# Patient Record
Sex: Male | Born: 1975
Health system: Southern US, Community
[De-identification: ages and names within clinical notes are randomized; demographics above are authoritative.]

## PROBLEM LIST (undated history)

## (undated) DIAGNOSIS — E785 Hyperlipidemia, unspecified: Secondary | ICD-10-CM

## (undated) DIAGNOSIS — I10 Essential (primary) hypertension: Secondary | ICD-10-CM

## (undated) HISTORY — DX: Hyperlipidemia, unspecified: E78.5

## (undated) HISTORY — DX: Essential (primary) hypertension: I10

---

## 2011-03-09 ENCOUNTER — Encounter (HOSPITAL_COMMUNITY): Payer: Self-pay

## 2011-03-09 ENCOUNTER — Emergency Department (INDEPENDENT_AMBULATORY_CARE_PROVIDER_SITE_OTHER)
Admission: EM | Admit: 2011-03-09 | Discharge: 2011-03-09 | Disposition: A | Discharge status: Home / Self Care | Payer: BC Managed Care – PPO | Source: Home / Self Care | Attending: Emergency Medicine | Admitting: Emergency Medicine

## 2011-03-09 ENCOUNTER — Emergency Department (INDEPENDENT_AMBULATORY_CARE_PROVIDER_SITE_OTHER): Payer: BC Managed Care – PPO

## 2011-03-09 DIAGNOSIS — S63601A Unspecified sprain of right thumb, initial encounter: Secondary | ICD-10-CM

## 2011-03-09 DIAGNOSIS — S6390XA Sprain of unspecified part of unspecified wrist and hand, initial encounter: Secondary | ICD-10-CM

## 2011-03-09 MED ORDER — IBUPROFEN 600 MG PO TABS: 600.0000 mg | ORAL_TABLET | Freq: Four times a day (QID) | ORAL | Status: AC | PRN

## 2011-03-09 MED ORDER — HYDROCODONE-ACETAMINOPHEN 5-325 MG PO TABS: 2.0000 | ORAL_TABLET | ORAL | Status: AC | PRN

## 2011-03-09 NOTE — ED Provider Notes (Signed)
History     CSN: [id number]  Arrival date & time 03/09/11  1238   First MD Initiated Contact with Patient 03/09/11 1251      Chief Complaint  Patient presents with  . Hand Injury    (Consider location/radiation/quality/duration/timing/severity/associated sxs/prior treatment) HPI Comments: Patient is a right-handed male who states that he slipped and fell on some ice earlier today landing on his right outstretched thumb/hand. States that the thumb was forcibly deviated radially. Reports some pain, swelling in his thumb. No paresthesias, redness, gross deformity, weakness. No bruising, abrasion. No nausea, vomiting, fevers. No injury to this hand, wrist, forearm, elbow, shoulder. Has not tried anything for this.  Patient is a 36 y.o. male presenting with hand injury. The history is provided by the patient. No language interpreter was used.  Hand Injury  The incident occurred 3 to 5 hours ago. The incident occurred at home. The injury mechanism was a fall. The pain is present in the right hand. The quality of the pain is described as aching and throbbing. The pain has been constant since the incident. Pertinent negatives include no fever. The symptoms are aggravated by movement, palpation and use. He has tried nothing for the symptoms. The treatment provided no relief.    History reviewed. No pertinent past medical history.  History reviewed. No pertinent past surgical history.  History reviewed. No pertinent family history.  History  Substance Use Topics  . Smoking status: Not on file  . Smokeless tobacco: Not on file  . Alcohol Use: Not on file      Review of Systems  Constitutional: Negative for fever.  Musculoskeletal: Positive for arthralgias.  Skin: Negative for rash and wound.  Neurological: Negative for weakness and numbness.    Allergies  Review of patient's allergies indicates no known allergies.  Home Medications   Current Outpatient Rx  Name Route Sig  Dispense Refill  . HYDROCODONE-ACETAMINOPHEN 5-325 MG PO TABS Oral Take 2 tablets by mouth every 4 (four) hours as needed for pain. 20 tablet 0  . IBUPROFEN 600 MG PO TABS Oral Take 1 tablet (600 mg total) by mouth every 6 (six) hours as needed for pain. 30 tablet 0    BP 146/98  Pulse 63  Temp(Src) 98.2 F (36.8 C) (Oral)  Resp 18  SpO2 100%  Physical Exam  Nursing note and vitals reviewed. Constitutional: He is oriented to person, place, and time. He appears well-developed and well-nourished.  HENT:  Head: Normocephalic and atraumatic.  Eyes: Conjunctivae and EOM are normal.  Neck: Normal range of motion.  Cardiovascular: Normal rate.   Pulmonary/Chest: Effort normal. No respiratory distress.  Abdominal: He exhibits no distension.  Musculoskeletal: Normal range of motion.       Right wrist: Normal.       Hands:      Tenderness at right MP joint. No laxity on varus/valgus stress. Baseline Strength and sensation in median/radial/ulnar nerve distribution with CR< 2 secs and pulse intact.  Hand with intact motor strength 5/5 flexion / extension against resistance. Skin intact. No signs of trauma. Wrist WNL.    Neurological: He is alert and oriented to person, place, and time.  Skin: Skin is warm and dry.  Psychiatric: He has a normal mood and affect. His behavior is normal.    ED Course  Procedures (including critical care time)  Labs Reviewed - No data to display Dg Hand Complete Right  03/09/2011  *RADIOLOGY REPORT*  Clinical Data: Larey Seat this morning, now  with pain  RIGHT HAND - COMPLETE 3+ VIEW  Comparison: None.  Findings: The radiocarpal joint space appears normal.  The carpal bones are in normal position.  MCP, PIP, and DIP joints appear normal.  No acute abnormality is seen.  IMPRESSION: No acute abnormality.  Original Report Authenticated By: Juline Patch, M.D.     1. Sprain of right thumb    Imaging reviewed by myself. Report per radiologist.    MDM  Suspect  sprain of the ulnar collateral ligament. MC joint stable. No fracture seen on x-ray. Reviewed this with patient. Will place in thumb spica, and have him follow up with hand and approximately 10 days.  Luiz Blare, MD 03/09/11 [phone]

## 2011-03-09 NOTE — Discharge Instructions (Signed)
Wear the splint until you are evaluated by a hand specialist. This may take up to 4-6 weeks to completely heal. You may take it out for range of motion exercises in several days. Right now he needs to wait until starts getting better. Return if you a fever above 100.4, if you get worse, if you have redness, swelling, or for any other concerns.

## 2011-03-09 NOTE — ED Notes (Signed)
States he fell on ice earlier today, put out right hand to catch himself , and injured hand. . C/o pain MP joint and MC bone of thumb

## 2013-05-04 ENCOUNTER — Encounter (HOSPITAL_COMMUNITY): Payer: Self-pay | Admitting: Emergency Medicine

## 2013-05-04 ENCOUNTER — Emergency Department (HOSPITAL_COMMUNITY): Payer: BC Managed Care – PPO

## 2013-05-04 ENCOUNTER — Emergency Department (HOSPITAL_COMMUNITY)
Admission: EM | Admit: 2013-05-04 | Discharge: 2013-05-04 | Disposition: A | Discharge status: Home / Self Care | Payer: BC Managed Care – PPO | Attending: Emergency Medicine | Admitting: Emergency Medicine

## 2013-05-04 DIAGNOSIS — F411 Generalized anxiety disorder: Secondary | ICD-10-CM | POA: Insufficient documentation

## 2013-05-04 DIAGNOSIS — R079 Chest pain, unspecified: Secondary | ICD-10-CM

## 2013-05-04 DIAGNOSIS — R072 Precordial pain: Secondary | ICD-10-CM | POA: Insufficient documentation

## 2013-05-04 DIAGNOSIS — R42 Dizziness and giddiness: Secondary | ICD-10-CM | POA: Insufficient documentation

## 2013-05-04 LAB — COMPREHENSIVE METABOLIC PANEL
ALBUMIN: 4.2 g/dL (ref 3.5–5.2)
ALT: 31 U/L (ref 0–53)
AST: 24 U/L (ref 0–37)
Alkaline Phosphatase: 78 U/L (ref 39–117)
BUN: 12 mg/dL (ref 6–23)
CO2: 26 mEq/L (ref 19–32)
CREATININE: 1.17 mg/dL (ref 0.50–1.35)
Calcium: 9.9 mg/dL (ref 8.4–10.5)
Chloride: 102 mEq/L (ref 96–112)
GFR calc Af Amer: 89 mL/min — ABNORMAL LOW (ref >90)
GFR calc non Af Amer: 77 mL/min — ABNORMAL LOW (ref >90)
Glucose, Bld: 101 mg/dL — ABNORMAL HIGH (ref 70–99)
Potassium: 4.1 mEq/L (ref 3.7–5.3)
Sodium: 144 mEq/L (ref 137–147)
TOTAL PROTEIN: 8 g/dL (ref 6.0–8.3)
Total Bilirubin: 0.5 mg/dL (ref 0.3–1.2)

## 2013-05-04 LAB — TROPONIN I

## 2013-05-04 LAB — CBC
HCT: 51.1 % (ref 39.0–52.0)
Hemoglobin: 17.7 g/dL — ABNORMAL HIGH (ref 13.0–17.0)
MCH: 28.5 pg (ref 26.0–34.0)
MCHC: 34.6 g/dL (ref 30.0–36.0)
MCV: 82.3 fL (ref 78.0–100.0)
PLATELETS: 194 10*3/uL (ref 150–400)
RBC: 6.21 MIL/uL — ABNORMAL HIGH (ref 4.22–5.81)
RDW: 13.8 % (ref 11.5–15.5)
WBC: 7.5 10*3/uL (ref 4.0–10.5)

## 2013-05-04 NOTE — ED Notes (Signed)
EDP AWARE OF RESULT. WILL DISPO PT

## 2013-05-04 NOTE — Discharge Instructions (Signed)
If gi symptoms, you may try pepcid and maalox as need for symptom relief. For chest discomfort, follow up with cardiologist in coming week - see referral - call office to arrange appointment. Return to ER right away if worse, persistent or recurrent chest pain, trouble breathing, weak/faint, other concern.      Chest Pain (Nonspecific) It is often hard to give a specific diagnosis for the cause of chest pain. There is always a chance that your pain could be related to something serious, such as a heart attack or a blood clot in the lungs. You need to follow up with your caregiver for further evaluation. CAUSES   Heartburn.  Pneumonia or bronchitis.  Anxiety or stress.  Inflammation around your heart (pericarditis) or lung (pleuritis or pleurisy).  A blood clot in the lung.  A collapsed lung (pneumothorax). It can develop suddenly on its own (spontaneous pneumothorax) or from injury (trauma) to the chest.  Shingles infection (herpes zoster virus). The chest wall is composed of bones, muscles, and cartilage. Any of these can be the source of the pain.  The bones can be bruised by injury.  The muscles or cartilage can be strained by coughing or overwork.  The cartilage can be affected by inflammation and become sore (costochondritis). DIAGNOSIS  Lab tests or other studies, such as X-rays, electrocardiography, stress testing, or cardiac imaging, may be needed to find the cause of your pain.  TREATMENT   Treatment depends on what may be causing your chest pain. Treatment may include:  Acid blockers for heartburn.  Anti-inflammatory medicine.  Pain medicine for inflammatory conditions.  Antibiotics if an infection is present.  You may be advised to change lifestyle habits. This includes stopping smoking and avoiding alcohol, caffeine, and chocolate.  You may be advised to keep your head raised (elevated) when sleeping. This reduces the chance of acid going backward from your  stomach into your esophagus.  Most of the time, nonspecific chest pain will improve within 2 to 3 days with rest and mild pain medicine. HOME CARE INSTRUCTIONS   If antibiotics were prescribed, take your antibiotics as directed. Finish them even if you start to feel better.  For the next few days, avoid physical activities that bring on chest pain. Continue physical activities as directed.  Do not smoke.  Avoid drinking alcohol.  Only take over-the-counter or prescription medicine for pain, discomfort, or fever as directed by your caregiver.  Follow your caregiver's suggestions for further testing if your chest pain does not go away.  Keep any follow-up appointments you made. If you do not go to an appointment, you could develop lasting (chronic) problems with pain. If there is any problem keeping an appointment, you must call to reschedule. SEEK MEDICAL CARE IF:   You think you are having problems from the medicine you are taking. Read your medicine instructions carefully.  Your chest pain does not go away, even after treatment.  You develop a rash with blisters on your chest. SEEK IMMEDIATE MEDICAL CARE IF:   You have increased chest pain or pain that spreads to your arm, neck, jaw, back, or abdomen.  You develop shortness of breath, an increasing cough, or you are coughing up blood.  You have severe back or abdominal pain, feel nauseous, or vomit.  You develop severe weakness, fainting, or chills.  You have a fever. THIS IS AN EMERGENCY. Do not wait to see if the pain will go away. Get medical help at once. Call your  local emergency services (911 in [place] Do not drive yourself to the hospital. MAKE SURE YOU:   Understand these instructions.  Will watch your condition.  Will get help right away if you are not doing well or get worse. Document Released: 10/11/2004 Document Revised: 03/26/2011 Document Reviewed: 08/07/2007 [facility] Patient Information 2014 [place].

## 2013-05-04 NOTE — ED Provider Notes (Signed)
CSN: [id number]     Arrival date & time 05/04/13  6606 History   First MD Initiated Contact with Patient 05/04/13 [phone]     Chief Complaint  Patient presents with  . Dizziness  . Chest Pain     (Consider location/radiation/quality/duration/timing/severity/associated sxs/prior Treatment) Patient is a 38 y.o. male presenting with dizziness and chest pain. The history is provided by the patient.  Dizziness Associated symptoms: chest pain   Associated symptoms: no headaches, no palpitations, no shortness of breath and no vomiting   Chest Pain Associated symptoms: dizziness   Associated symptoms: no abdominal pain, no back pain, no cough, no fever, no headache, no palpitations, no shortness of breath and not vomiting   pt c/o mid chest pain x 1 week. Located midline, lower sternal area. At rest, no relation to activity level or exertion. Dull/pressure. Non radiating. No associated nv or diaphoresis. States at times will cause anxiety, and give feeling as if not breathing right, although denies acute dyspnea or any unusual doe. Denies heartburn, no hx gerd. Non smoker, no hx htn , or high chol. States symptoms constant in past day/continual. No change in symptoms whether upright or supine. No change w exertion. No leg pain or swelling. No pleuritic pain. No hx dvt or pe. No fam hx cad/premature cad. Denies any cocaine or drug use. States earlier felt dizzy, faint. No loc. No palpitations or sense of rapid or irregular heartbeat.      History reviewed. No pertinent past medical history. History reviewed. No pertinent past surgical history. Family History  Problem Relation Age of Onset  . Hypertension Mother    History  Substance Use Topics  . Smoking status: Never Smoker   . Smokeless tobacco: Not on file  . Alcohol Use: Yes    Review of Systems  Constitutional: Negative for fever and chills.  HENT: Negative for sore throat.   Eyes: Negative for redness.  Respiratory: Negative for  cough and shortness of breath.   Cardiovascular: Positive for chest pain. Negative for palpitations and leg swelling.  Gastrointestinal: Negative for vomiting and abdominal pain.  Genitourinary: Negative for flank pain.  Musculoskeletal: Negative for back pain and neck pain.  Skin: Negative for rash.  Neurological: Positive for dizziness. Negative for syncope and headaches.  Hematological: Does not bruise/bleed easily.  Psychiatric/Behavioral: Negative for confusion.      Allergies  Review of patient's allergies indicates no known allergies.  Home Medications   Prior to Admission medications   Not on File   BP 146/93  Pulse 72  Temp(Src) 97.4 F (36.3 C) (Oral)  Resp 17  Ht 5\' 7"  (1.702 m)  Wt 200 lb (90.719 kg)  BMI 31.32 kg/m2  SpO2 97% Physical Exam  Nursing note and vitals reviewed. Constitutional: He is oriented to person, place, and time. He appears well-developed and well-nourished. No distress.  HENT:  Mouth/Throat: Oropharynx is clear and moist.  Eyes: Conjunctivae are normal. No scleral icterus.  Neck: Neck supple. No tracheal deviation present.  Cardiovascular: Normal rate, regular rhythm, normal heart sounds and intact distal pulses.  Exam reveals no gallop and no friction rub.   No murmur heard. Pulmonary/Chest: Effort normal and breath sounds normal. No accessory muscle usage. No respiratory distress.  Abdominal: Soft. Bowel sounds are normal. He exhibits no distension and no mass. There is no tenderness. There is no rebound and no guarding.  Musculoskeletal: Normal range of motion. He exhibits no edema and no tenderness.  Neurological: He is alert  and oriented to person, place, and time.  Skin: Skin is warm and dry. He is not diaphoretic.  Psychiatric: He has a normal mood and affect.    ED Course  Procedures (including critical care time)  Results for orders placed during the hospital encounter of 05/04/13  CBC      Result Value Ref Range   WBC 7.5   4.0 - 10.5 K/uL   RBC 6.21 (*) 4.22 - 5.81 MIL/uL   Hemoglobin 17.7 (*) 13.0 - 17.0 g/dL   HCT 51.1  39.0 - 52.0 %   MCV 82.3  78.0 - 100.0 fL   MCH 28.5  26.0 - 34.0 pg   MCHC 34.6  30.0 - 36.0 g/dL   RDW 13.8  11.5 - 15.5 %   Platelets 194  150 - 400 K/uL  TROPONIN I      Result Value Ref Range   Troponin I <0.30  <0.30 ng/mL  COMPREHENSIVE METABOLIC PANEL      Result Value Ref Range   Sodium 144  137 - 147 mEq/L   Potassium 4.1  3.7 - 5.3 mEq/L   Chloride 102  96 - 112 mEq/L   CO2 26  19 - 32 mEq/L   Glucose, Bld 101 (*) 70 - 99 mg/dL   BUN 12  6 - 23 mg/dL   Creatinine, Ser 1.17  0.50 - 1.35 mg/dL   Calcium 9.9  8.4 - 10.5 mg/dL   Total Protein 8.0  6.0 - 8.3 g/dL   Albumin 4.2  3.5 - 5.2 g/dL   AST 24  0 - 37 U/L   ALT 31  0 - 53 U/L   Alkaline Phosphatase 78  39 - 117 U/L   Total Bilirubin 0.5  0.3 - 1.2 mg/dL   GFR calc non Af Amer 77 (*) >90 mL/min   GFR calc Af Amer 89 (*) >90 mL/min  TROPONIN I      Result Value Ref Range   Troponin I <0.30  <0.30 ng/mL   Dg Chest 2 View (if Patient Has Fever And/or Copd)  05/04/2013   CLINICAL DATA:  Dizziness.  Chest pain.  EXAM: CHEST  2 VIEW  COMPARISON:  None.  FINDINGS: Heart size is normal. Mediastinal shadows are normal. The lungs are clear. No effusions. No bony abnormalities.  IMPRESSION: Normal chest   Electronically Signed   By: Nelson Chimes M.D.   On: 05/04/2013 10:28      EKG Interpretation   Date/Time:  Monday May 04 2013 09:35:38 EDT Ventricular Rate:  67 PR Interval:  130 QRS Duration: 80 QT Interval:  382 QTC Calculation: 403 R Axis:   15 Text Interpretation:  Normal sinus rhythm Normal ECG No previous tracing  Confirmed by Ashok Cordia  MD, Lennette Bihari ([id number]) on 05/04/2013 9:52:34 AM      MDM  Iv ns, labs. Cxr. Ecg.  Reviewed nursing notes and prior charts for additional history.   After constant symptoms x 24 hrs+, troponin x 2 neg. cxr neg acute.  Recheck pt symptom free. No c/o. No chest discomfort.  No sob.  Pt appears stable for d/c.     Mirna Mires, MD 05/04/13 [phone]

## 2013-05-04 NOTE — ED Notes (Signed)
Pt c/o left sided chest pain and dizziness x 1 week. Pain does not radiate. Pt talking in complete sentences without difficulty, skin warm dry, color good, grips equal and strong, no facial droop.

## 2013-05-04 NOTE — ED Notes (Signed)
PT DENIES CP OR SOB AT DISCHARGE. STATES HIS WIFE HAS MADE HIM AN APPOINTMENT TO GET ESTABLISHED WITH A PMD

## 2013-09-26 IMAGING — CR DG HAND COMPLETE 3+V*R*
3 series · 3 of 3 positions shown · non-contrast
Comparison: None.

CLINICAL DATA: Fell this morning, now with pain

RIGHT HAND - COMPLETE 3+ VIEW

[view not recorded (1 of 3)]
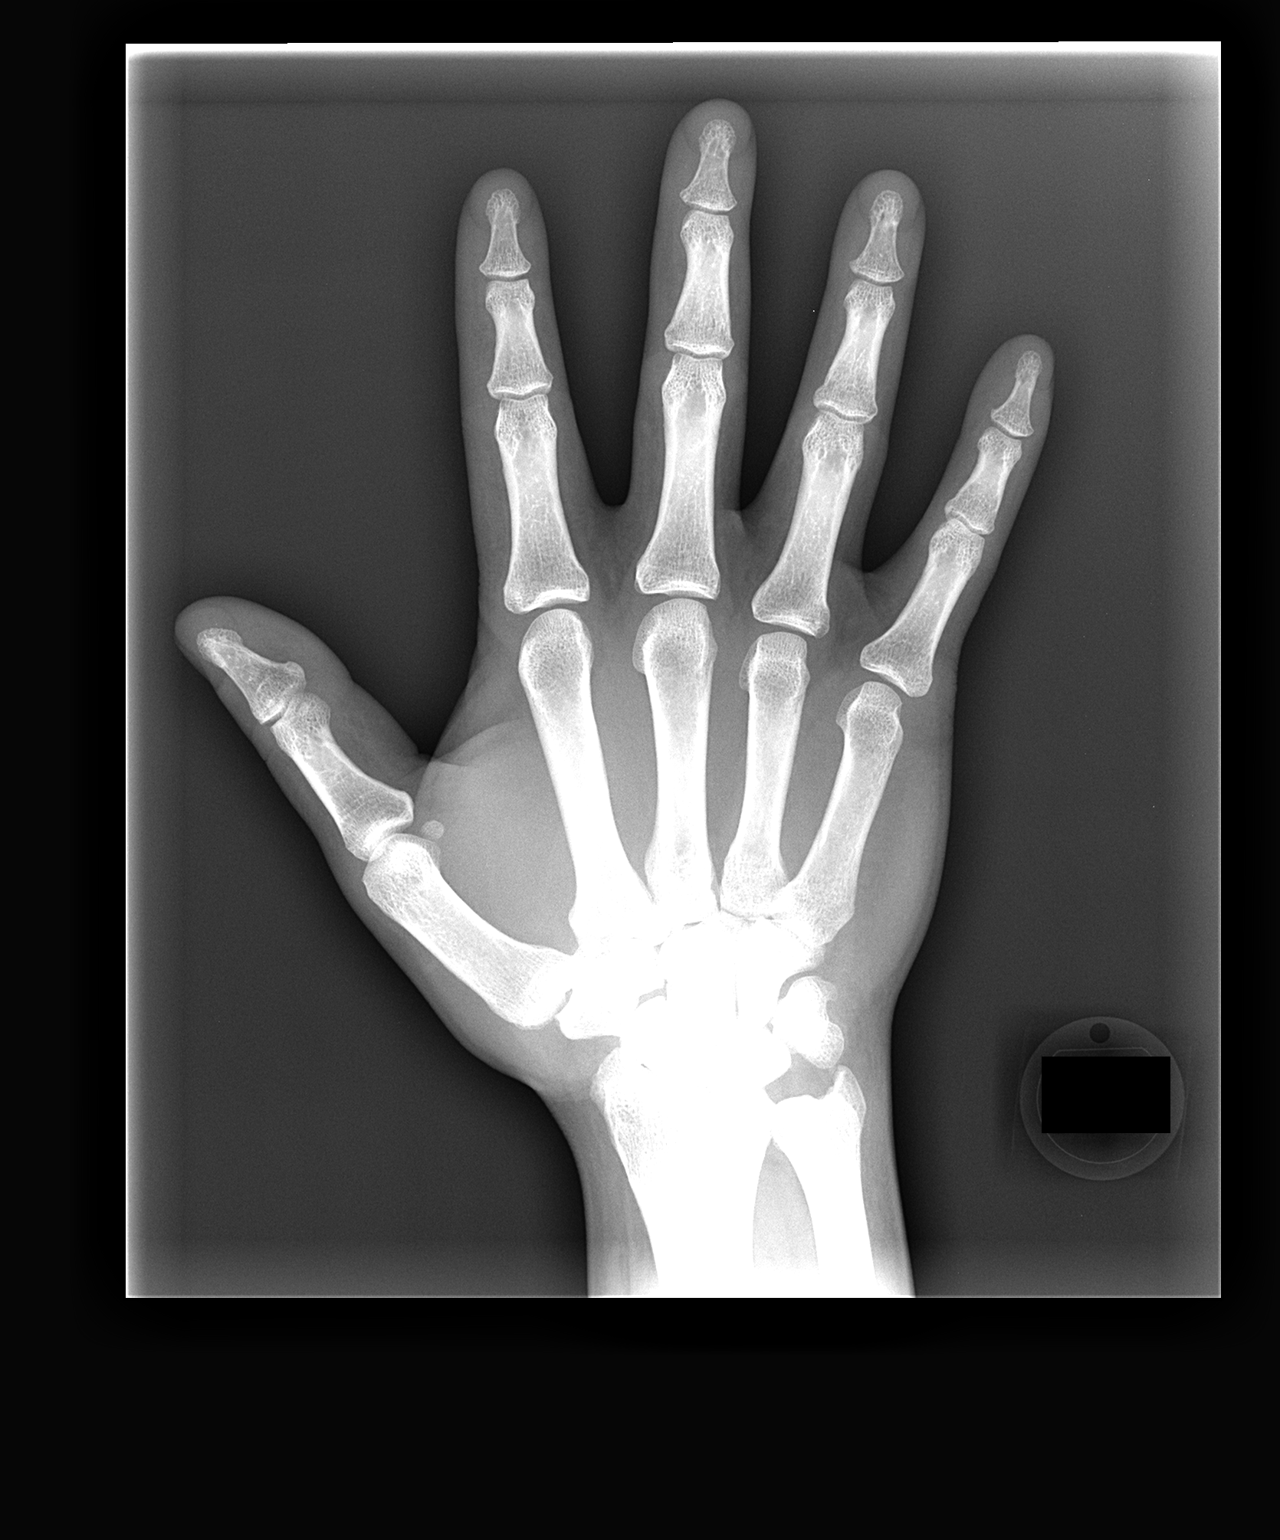

[view not recorded (2 of 3)]
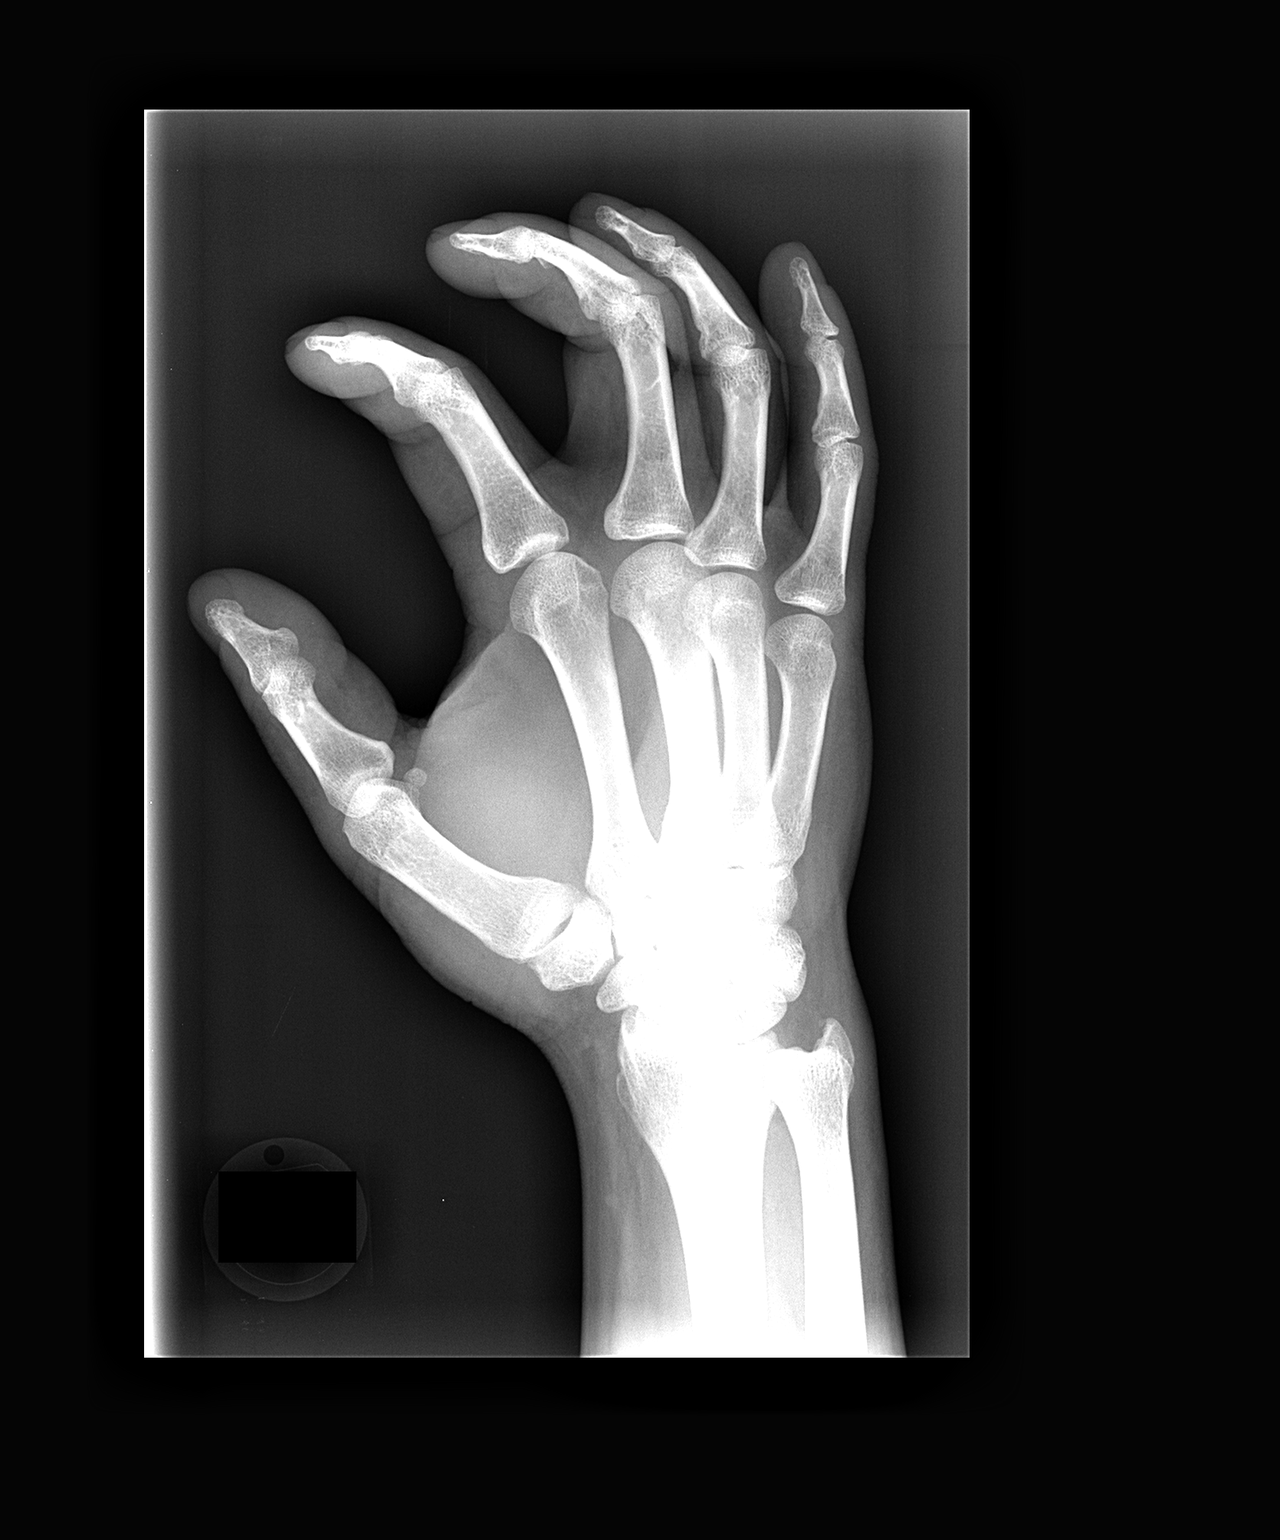

[view not recorded (3 of 3)]
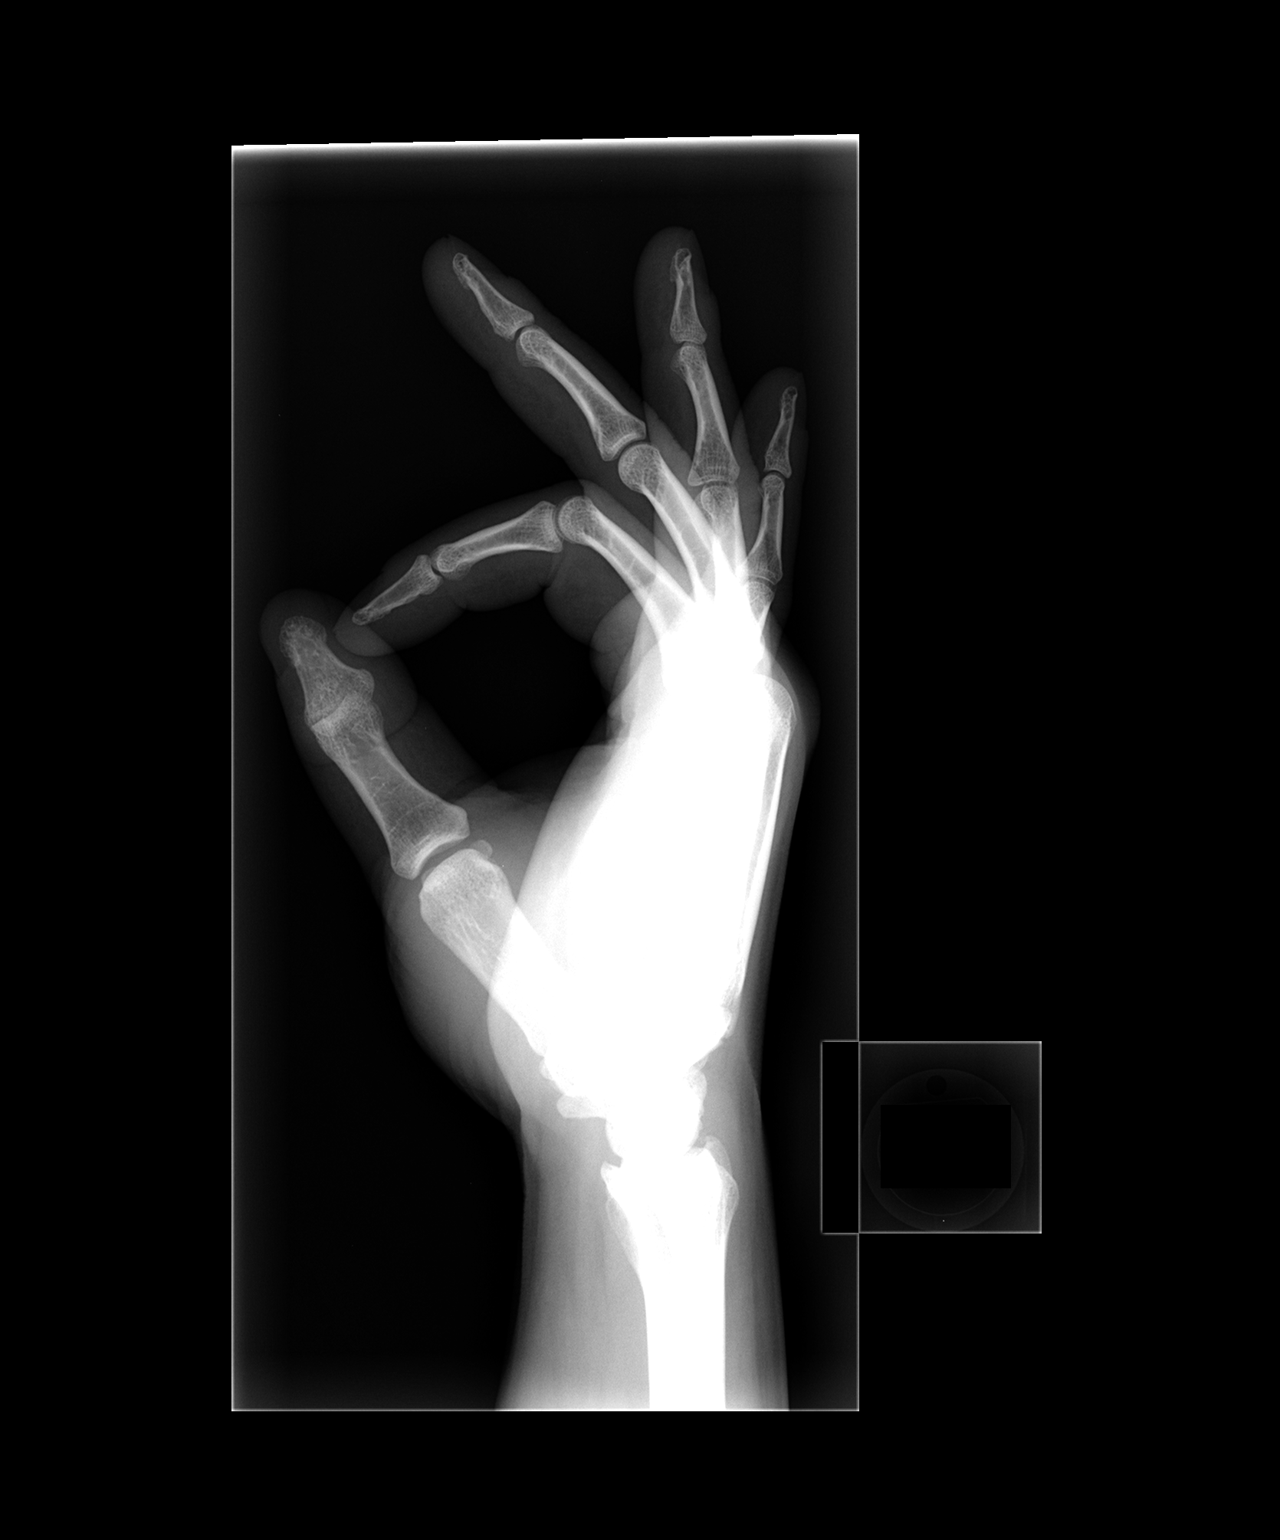

[3 of 3 positions shown; findings below may reference images not displayed]

FINDINGS: The radiocarpal joint space appears normal.  The carpal
bones are in normal position.  MCP, PIP, and DIP joints appear
normal.  No acute abnormality is seen.
IMPRESSION: No acute abnormality.

## 2014-11-09 ENCOUNTER — Encounter: Payer: Self-pay | Admitting: Family Medicine

## 2014-11-09 DIAGNOSIS — I10 Essential (primary) hypertension: Secondary | ICD-10-CM | POA: Insufficient documentation

## 2014-11-18 ENCOUNTER — Encounter: Payer: Self-pay | Admitting: Family Medicine

## 2014-11-18 ENCOUNTER — Ambulatory Visit (INDEPENDENT_AMBULATORY_CARE_PROVIDER_SITE_OTHER): Payer: No Typology Code available for payment source | Admitting: Family Medicine

## 2014-11-18 VITALS — BP 150/86 | HR 86 | Temp 98.1°F | Resp 14 | Ht 67.0 in | Wt 214.0 lb

## 2014-11-18 DIAGNOSIS — Z0000 Encounter for general adult medical examination without abnormal findings: Secondary | ICD-10-CM | POA: Diagnosis not present

## 2014-11-18 DIAGNOSIS — G4730 Sleep apnea, unspecified: Secondary | ICD-10-CM

## 2014-11-18 DIAGNOSIS — Z7689 Persons encountering health services in other specified circumstances: Secondary | ICD-10-CM

## 2014-11-18 DIAGNOSIS — I10 Essential (primary) hypertension: Secondary | ICD-10-CM | POA: Diagnosis not present

## 2014-11-18 DIAGNOSIS — Z7189 Other specified counseling: Secondary | ICD-10-CM | POA: Diagnosis not present

## 2014-11-18 LAB — COMPLETE METABOLIC PANEL WITH GFR
ALBUMIN: 4.4 g/dL (ref 3.6–5.1)
ALK PHOS: 73 U/L (ref 40–115)
ALT: 31 U/L (ref 9–46)
AST: 23 U/L (ref 10–40)
BILIRUBIN TOTAL: 0.7 mg/dL (ref 0.2–1.2)
BUN: 12 mg/dL (ref 7–25)
CALCIUM: 9.2 mg/dL (ref 8.6–10.3)
CO2: 25 mmol/L (ref 20–31)
CREATININE: 1.13 mg/dL (ref 0.60–1.35)
Chloride: 99 mmol/L (ref 98–110)
GFR, EST NON AFRICAN AMERICAN: 80 mL/min (ref >=60)
Glucose, Bld: 86 mg/dL (ref 70–99)
Potassium: 4 mmol/L (ref 3.5–5.3)
Sodium: 136 mmol/L (ref 135–146)
TOTAL PROTEIN: 7.2 g/dL (ref 6.1–8.1)

## 2014-11-18 LAB — CBC WITH DIFFERENTIAL/PLATELET
BASOS ABS: 0 10*3/uL (ref 0.0–0.1)
Basophils Relative: 0 % (ref 0–1)
EOS PCT: 2 % (ref 0–5)
Eosinophils Absolute: 0.1 10*3/uL (ref 0.0–0.7)
HEMATOCRIT: 47.9 % (ref 39.0–52.0)
Hemoglobin: 16.7 g/dL (ref 13.0–17.0)
LYMPHS ABS: 1.7 10*3/uL (ref 0.7–4.0)
LYMPHS PCT: 24 % (ref 12–46)
MCH: 27.8 pg (ref 26.0–34.0)
MCHC: 34.9 g/dL (ref 30.0–36.0)
MCV: 79.8 fL (ref 78.0–100.0)
MONOS PCT: 7 % (ref 3–12)
MPV: 10.1 fL (ref 8.6–12.4)
Monocytes Absolute: 0.5 10*3/uL (ref 0.1–1.0)
NEUTROS PCT: 67 % (ref 43–77)
Neutro Abs: 4.8 10*3/uL (ref 1.7–7.7)
Platelets: 197 10*3/uL (ref 150–400)
RBC: 6 MIL/uL — ABNORMAL HIGH (ref 4.22–5.81)
RDW: 14.5 % (ref 11.5–15.5)
WBC: 7.2 10*3/uL (ref 4.0–10.5)

## 2014-11-18 LAB — LIPID PANEL
CHOLESTEROL: 244 mg/dL — AB (ref 125–200)
HDL: 40 mg/dL (ref >=40)
LDL CALC: 159 mg/dL — AB (ref <130)
TRIGLYCERIDES: 224 mg/dL — AB (ref <150)
Total CHOL/HDL Ratio: 6.1 Ratio — ABNORMAL HIGH (ref <=5.0)
VLDL: 45 mg/dL — ABNORMAL HIGH (ref <30)

## 2014-11-18 NOTE — Progress Notes (Signed)
Subjective:    Patient ID: Leonard Schmitt, male    DOB: 1975-02-19, 39 y.o.   MRN: [id number]  HPI Patient is here today to establish care. His blood pressure is elevated at 150/86. He states that he is in a doctor's office although he has a strong family history of hypertension. He also reports apneic episodes 7-10 nights out of the month. He will wake up gasping for air in the middle the night or his wife will have to wake him up to make him start breathing. However he denies daytime somnolence, fatigue, or headaches. Past Medical History  Diagnosis Date  . Hypertension     borderline   No past surgical history on file. Current Outpatient Prescriptions on File Prior to Visit  Medication Sig Dispense Refill  . ibuprofen (ADVIL,MOTRIN) 200 MG tablet Take 200 mg by mouth every 6 (six) hours as needed for headache.     No current facility-administered medications on file prior to visit.   No Known Allergies Social History   Social History  . Marital Status: Married    Spouse Name: N/A  . Number of Children: N/A  . Years of Education: N/A   Occupational History  . Not on file.   Social History Main Topics  . Smoking status: Never Smoker   . Smokeless tobacco: Never Used  . Alcohol Use: 3.6 - 4.2 oz/week    4 Cans of beer, 2-3 Shots of liquor per week  . Drug Use: No  . Sexual Activity: Yes   Other Topics Concern  . Not on file   Social History Narrative   Family History  Problem Relation Age of Onset  . Hypertension Mother   . Stroke Mother   . Hyperlipidemia Brother   . Hypertension Brother   . Stroke Maternal Grandmother   . Stroke Maternal Grandfather       Review of Systems  All other systems reviewed and are negative.      Objective:   Physical Exam  Constitutional: He is oriented to person, place, and time. He appears well-developed and well-nourished. No distress.  HENT:  Head: Normocephalic and atraumatic.  Right Ear: External ear normal.  Left  Ear: External ear normal.  Nose: Nose normal.  Mouth/Throat: Oropharynx is clear and moist. No oropharyngeal exudate.  Eyes: Conjunctivae and EOM are normal. Pupils are equal, round, and reactive to light. Right eye exhibits no discharge. Left eye exhibits no discharge. No scleral icterus.  Neck: Normal range of motion. Neck supple. No JVD present. No tracheal deviation present. No thyromegaly present.  Cardiovascular: Normal rate, regular rhythm, normal heart sounds and intact distal pulses.  Exam reveals no gallop and no friction rub.   No murmur heard. Pulmonary/Chest: Effort normal and breath sounds normal. No stridor. No respiratory distress. He has no wheezes. He has no rales. He exhibits no tenderness.  Abdominal: Soft. Bowel sounds are normal. He exhibits no distension and no mass. There is no tenderness. There is no rebound and no guarding.  Genitourinary: Penis normal.  Musculoskeletal: Normal range of motion. He exhibits no edema or tenderness.  Lymphadenopathy:    He has no cervical adenopathy.  Neurological: He is alert and oriented to person, place, and time. He displays normal reflexes. No cranial nerve deficit. He exhibits normal muscle tone. Coordination normal.  Skin: Skin is warm. No rash noted. He is not diaphoretic. No erythema. No pallor.  Psychiatric: He has a normal mood and affect. His behavior is normal. Judgment  and thought content normal.  Vitals reviewed.         Assessment & Plan:  Routine general medical examination at a health care facility - Plan: CBC with Differential/Platelet, COMPLETE METABOLIC PANEL WITH GFR, Lipid panel  Establishing care with new doctor, encounter for  HTN (hypertension) with goal to be determined  Apnea, sleep - Plan: Ambulatory referral to Sleep Studies  Given the frequency of the apneic episodes, even though he is asymptomatic, I would recommend a sleep study to evaluate for obstructive sleep apnea. His blood pressure is  elevated today. I would like him to check his blood pressure everyday for the next 2 weeks and then notify me of the values. If greater than 140/90, I would recommend medication to treat. I recommended a flu shot but he declined. I will check a CBC, CMP, and a fasting lipid panel

## 2014-11-24 ENCOUNTER — Other Ambulatory Visit: Payer: Self-pay | Admitting: Family Medicine

## 2014-11-24 DIAGNOSIS — Z79899 Other long term (current) drug therapy: Secondary | ICD-10-CM

## 2014-11-24 DIAGNOSIS — E785 Hyperlipidemia, unspecified: Secondary | ICD-10-CM

## 2014-11-24 MED ORDER — ATORVASTATIN CALCIUM 20 MG PO TABS: 20.0000 mg | ORAL_TABLET | Freq: Every day | ORAL | Status: DC

## 2014-11-25 ENCOUNTER — Encounter: Payer: Self-pay | Admitting: Neurology

## 2014-11-25 ENCOUNTER — Ambulatory Visit (INDEPENDENT_AMBULATORY_CARE_PROVIDER_SITE_OTHER): Payer: No Typology Code available for payment source | Admitting: Neurology

## 2014-11-25 VITALS — BP 152/78 | HR 78 | Resp 18 | Ht 67.0 in | Wt 212.0 lb

## 2014-11-25 DIAGNOSIS — R0689 Other abnormalities of breathing: Secondary | ICD-10-CM

## 2014-11-25 DIAGNOSIS — R351 Nocturia: Secondary | ICD-10-CM | POA: Diagnosis not present

## 2014-11-25 DIAGNOSIS — G4733 Obstructive sleep apnea (adult) (pediatric): Secondary | ICD-10-CM

## 2014-11-25 DIAGNOSIS — G4719 Other hypersomnia: Secondary | ICD-10-CM | POA: Diagnosis not present

## 2014-11-25 DIAGNOSIS — E669 Obesity, unspecified: Secondary | ICD-10-CM

## 2014-11-25 NOTE — Progress Notes (Signed)
Subjective:    Patient ID: Leonard Schmitt is a 39 y.o. male.  HPI     Star Age, MD, PhD [facility] [address], [address] [place], [place] [address]  Dear Dr. Dennard Schaumann,   I saw your patient, Leonard Schmitt, upon your kind request in my neurologic clinic today for initial consultation of his sleep disorder, in particular, concern for underlying obstructive sleep apnea. The patient is unaccompanied today. As you know, Leonard Schmitt is a 39 year old right-handed gentleman with an underlying medical history of borderline hypertension, and obesity, who reports snoring, and witnessed apneic pauses while asleep per wife's report. He reports waking up with a sense of gasping and in a panic at times. He he has some daytime tiredness but primarily difficulty maintaining sleep. Often he feels he only gets 4 hours of sleep in one stretch and has trouble falling asleep at times as well as staying asleep. He has nocturia once or twice per night on average. He has no morning headaches. He denies restless leg symptoms but is a restless sleeper. He is not aware of any family history of OSA or RLS. He goes to bed usually between 10 and 11 and sometimes later. 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 He does not smoke. He does not drink caffeine daily, he does not use illicit drugs, and drinks alcohol about 3-4 times per week.  His wakeup time usually is around 6 AM. He lives at home with his wife and 4 children. He has a TV in the bedroom and often watches this at night. He turns it off before falling asleep. His Epworth sleepiness score is 6 out of 24 today, his fatigue score is 23 out of 63 today. I reviewed your office note from 11/18/2014.  His Past Medical History Is Significant For: Past Medical History  Diagnosis Date  . Hypertension     borderline  . Hyperlipemia     His Past Surgical History Is Significant For: No past surgical  history on file.  His Family History Is Significant For: Family History  Problem Relation Age of Onset  . Hypertension Mother   . Stroke Mother   . Hyperlipidemia Brother   . Hypertension Brother   . Stroke Maternal Grandmother   . Stroke Maternal Grandfather     His Social History Is Significant For: Social History   Social History  . Marital Status: Married    Spouse Name: N/A  . Number of Children: N/A  . Years of Education: Masters   Occupational History  . [employer] History Main Topics  . Smoking status: Never Smoker   . Smokeless tobacco: Never Used  . Alcohol Use: 3.6 - 4.2 oz/week    4 Cans of beer, 2-3 Shots of liquor per week  . Drug Use: No  . Sexual Activity: Yes   Other Topics Concern  . None   Social History Narrative    His Allergies Are:  No Known Allergies:   His Current Medications Are:  Outpatient Encounter Prescriptions as of 11/25/2014  Medication Sig  . atorvastatin (LIPITOR) 20 MG tablet Take 1 tablet (20 mg total) by mouth daily at 6 PM.  . ibuprofen (ADVIL,MOTRIN) 200 MG tablet Take 200 mg by mouth every 6 (six) hours as needed for headache.   No facility-administered encounter medications on file as of 11/25/2014.  :  Review of Systems:  Out  of a complete 14 point review of systems, all are reviewed and negative with the exception of these symptoms as listed below:   Review of Systems  Neurological:       Has trouble staying asleep, sleep only about 4 hours at a time, snoring, witnessed apnea, wakes up feeling tired, takes a nap after work.   Epworth Sleepiness Scale 0= would never doze 1= slight chance of dozing 2= moderate chance of dozing 3= high chance of dozing  Sitting and reading:1 Watching TV:1 Sitting inactive in a public place (ex. Theater or meeting):1 As a passenger in a car for an hour without a break:0 Lying down to rest in the afternoon:2 Sitting and talking to someone:0 Sitting  quietly after lunch (no alcohol):1 In a car, while stopped in traffic:0 Total:6  Objective:  Neurologic Exam  Physical Exam Physical Examination:   Filed Vitals:   11/25/14 1012  BP: 152/78  Pulse: 78  Resp: 18    General Examination: The patient is a very pleasant 39 y.o. male in no acute distress. He appears well-developed and well-nourished and well groomed.   HEENT: Normocephalic, atraumatic, pupils are equal, round and reactive to light and accommodation. Funduscopic exam is normal with sharp disc margins noted. Extraocular tracking is good without limitation to gaze excursion or nystagmus noted. Normal smooth pursuit is noted. Hearing is grossly intact. Tympanic membranes are clear bilaterally. Face is symmetric with normal facial animation and normal facial sensation. Speech is clear with no dysarthria noted. There is no hypophonia. There is no lip, neck/head, jaw or voice tremor. Neck is supple with full range of passive and active motion. There are no carotid bruits on auscultation. Oropharynx exam reveals: mild mouth dryness, good dental hygiene and moderate airway crowding, due to large tongue, narrow airway entry and elongated uvula. Tonsils are small. Mallampati is class II. Tongue protrudes centrally and palate elevates symmetrically. Neck circumference is 18 and 1/8 inches. He has a Mild overbite. Nasal inspection reveals no significant nasal mucosal bogginess or redness and no septal deviation.   Chest: Clear to auscultation without wheezing, rhonchi or crackles noted.  Heart: S1+S2+0, regular and normal without murmurs, rubs or gallops noted.   Abdomen: Soft, non-tender and non-distended with normal bowel sounds appreciated on auscultation.  Extremities: There is no pitting edema in the distal lower extremities bilaterally. Pedal pulses are intact.  Skin: Warm and dry without trophic changes noted. There are no varicose veins.  Musculoskeletal: exam reveals no obvious  joint deformities, tenderness or joint swelling or erythema.   Neurologically:  Mental status: The patient is awake, alert and oriented in all 4 spheres. His immediate and remote memory, attention, language skills and fund of knowledge are appropriate. There is no evidence of aphasia, agnosia, apraxia or anomia. Speech is clear with normal prosody and enunciation. Thought process is linear. Mood is normal and affect is normal.  Cranial nerves II - XII are as described above under HEENT exam. In addition: shoulder shrug is normal with equal shoulder height noted. Motor exam: Normal bulk, strength and tone is noted. There is no drift, tremor or rebound. Romberg is negative. Reflexes are 2+ throughout. Babinski: Toes are flexor bilaterally. Fine motor skills and coordination: intact with normal finger taps, normal hand movements, normal rapid alternating patting, normal foot taps and normal foot agility.  Cerebellar testing: No dysmetria or intention tremor on finger to nose testing. Heel to shin is unremarkable bilaterally. There is no truncal or gait ataxia.  Sensory exam: intact to light touch, pinprick, vibration, temperature sense in the upper and lower extremities.  Gait, station and balance: He stands easily. No veering to one side is noted. No leaning to one side is noted. Posture is age-appropriate and stance is narrow based. Gait shows normal stride length and normal pace. No problems turning are noted. He turns en bloc. Tandem walk is unremarkable.   Assessment and Plan:  In summary, Leonard Schmitt is a very pleasant 39 y.o.-year old male with an underlying medical history of borderline hypertension, and obesity, whose history and physical exam are in keeping with obstructive sleep apnea (OSA). I had a long chat with the patient about my findings and the diagnosis of OSA, its prognosis and treatment options. We talked about medical treatments, surgical interventions and non-pharmacological  approaches. I explained in particular the risks and ramifications of untreated moderate to severe OSA, especially with respect to developing cardiovascular disease down the Road, including congestive heart failure, difficult to treat hypertension, cardiac arrhythmias, or stroke. Even type 2 diabetes has, in part, been linked to untreated OSA. Symptoms of untreated OSA include daytime sleepiness, memory problems, mood irritability and mood disorder such as depression and anxiety, lack of energy, as well as recurrent headaches, especially morning headaches. We talked about trying to maintain a  healthy lifestyle in general, as well as the importance of weight control. I encouraged the patient to eat healthy, exercise daily and keep well hydrated, to keep a scheduled bedtime and wake time routine, to not skip any meals and eat healthy snacks in between meals. I advised the patient not to drive when feeling sleepy. I recommended the following at this time: sleep study with potential positive airway pressure titration. (We will score hypopneas at 3% and split the sleep study into diagnostic and treatment portion, if the estimated. 2 hour AHI is >15/h).   I explained the sleep test procedure to the patient and also outlined possible surgical and non-surgical treatment options of OSA, including the use of a custom-made dental device (which would require a referral to a specialist dentist or oral surgeon), upper airway surgical options, such as pillar implants, radiofrequency surgery, tongue base surgery, and UPPP (which would involve a referral to an ENT surgeon). Rarely, jaw surgery such as mandibular advancement may be considered.  I also explained the CPAP treatment option to the patient, who indicated that he would be willing to try CPAP if the need arises. I explained the importance of being compliant with PAP treatment, not only for insurance purposes but primarily to improve His symptoms, and for the patient's  long term health benefit, including to reduce His cardiovascular risks. I answered all his questions today and the patient was in agreement. I would like to see him back after the sleep study is completed and encouraged him to call with any interim questions, concerns, problems or updates.   Thank you very much for allowing me to participate in the care of this nice patient. If I can be of any further assistance to you please do not hesitate to call me at [phone].  Sincerely,   Star Age, MD, PhD

## 2014-11-25 NOTE — Patient Instructions (Signed)

## 2014-12-16 ENCOUNTER — Encounter: Payer: No Typology Code available for payment source | Admitting: Family Medicine

## 2014-12-16 ENCOUNTER — Encounter: Payer: Self-pay | Admitting: Family Medicine

## 2014-12-16 ENCOUNTER — Encounter (INDEPENDENT_AMBULATORY_CARE_PROVIDER_SITE_OTHER): Payer: Self-pay

## 2014-12-16 ENCOUNTER — Ambulatory Visit (INDEPENDENT_AMBULATORY_CARE_PROVIDER_SITE_OTHER): Payer: No Typology Code available for payment source | Admitting: Family Medicine

## 2014-12-16 DIAGNOSIS — Z0184 Encounter for antibody response examination: Secondary | ICD-10-CM

## 2014-12-16 DIAGNOSIS — Z23 Encounter for immunization: Secondary | ICD-10-CM

## 2014-12-17 LAB — HEPATITIS A ANTIBODY, TOTAL: Hep A Total Ab: NONREACTIVE

## 2014-12-17 LAB — MEASLES/MUMPS/RUBELLA IMMUNITY
Mumps IgG: 183 AU/mL — ABNORMAL HIGH (ref <9.00)
Rubella: 2.87 Index — ABNORMAL HIGH (ref <0.90)
Rubeola IgG: 81.9 AU/mL — ABNORMAL HIGH (ref <25.00)

## 2014-12-17 LAB — VARICELLA ZOSTER ANTIBODY, IGG: VARICELLA IGG: 1314 {index} — AB (ref <135.00)

## 2014-12-17 LAB — HEPATITIS B SURFACE ANTIBODY, QUANTITATIVE: Hepatitis B-Post: 4.9 m[IU]/mL

## 2014-12-17 NOTE — Progress Notes (Signed)
This encounter was created in error - please disregard.

## 2014-12-18 LAB — QUANTIFERON TB GOLD ASSAY (BLOOD)
INTERFERON GAMMA RELEASE ASSAY: NEGATIVE
QUANTIFERON TB AG MINUS NIL: 0.02 [IU]/mL
Quantiferon Nil Value: 0.02 IU/mL
TB Ag value: 0.04 IU/mL

## 2015-03-04 ENCOUNTER — Other Ambulatory Visit: Payer: Self-pay | Admitting: Family Medicine

## 2015-03-04 NOTE — Telephone Encounter (Signed)
Refill appropriate and filled per protocol. 

## 2015-08-17 ENCOUNTER — Telehealth: Payer: Self-pay | Admitting: Family Medicine

## 2016-03-13 ENCOUNTER — Ambulatory Visit: Payer: No Typology Code available for payment source

## 2016-04-03 ENCOUNTER — Encounter: Payer: Self-pay | Admitting: Family Medicine

## 2016-04-03 ENCOUNTER — Ambulatory Visit (INDEPENDENT_AMBULATORY_CARE_PROVIDER_SITE_OTHER): Payer: No Typology Code available for payment source | Admitting: Family Medicine

## 2016-04-03 VITALS — BP 146/102 | HR 88 | Temp 98.0°F | Ht 67.0 in | Wt 205.0 lb

## 2016-04-03 DIAGNOSIS — N529 Male erectile dysfunction, unspecified: Secondary | ICD-10-CM

## 2016-04-03 DIAGNOSIS — I10 Essential (primary) hypertension: Secondary | ICD-10-CM

## 2016-04-03 NOTE — Progress Notes (Signed)
   Subjective:    Patient ID: Leonard Schmitt, male    DOB: Jun 08, 1975, 41 y.o.   MRN: [id number]  HPI The patient's blood pressure today is elevated. He states that he is checking his blood pressure at home and is typically 130/90 and borderline. He denies any chest pain shortness of breath or dyspnea on exertion. He does admit to drinking too much alcohol frequently throughout the week. He also needs a high sodium diet. He has not been exercising like he should. High blood pressure does run in his family. He also complains of erectile dysfunction. His libido is good. There are no stressors in his relationship with his wife. He does have performance anxiety due to erectile dysfunction Past Medical History:  Diagnosis Date  . Hyperlipemia   . Hypertension    borderline   No past surgical history on file. Current Outpatient Prescriptions on File Prior to Visit  Medication Sig Dispense Refill  . atorvastatin (LIPITOR) 20 MG tablet TAKE 1 TABLET (20 MG TOTAL) BY MOUTH DAILY AT 6 PM. (Patient not taking: Reported on 04/03/2016) 90 tablet 0   No current facility-administered medications on file prior to visit.    No Known Allergies Social History   Social History  . Marital status: Married    Spouse name: N/A  . Number of children: N/A  . Years of education: Masters   Occupational History  . [place] History Main Topics  . Smoking status: Never Smoker  . Smokeless tobacco: Never Used  . Alcohol use 3.6 - 4.2 oz/week    4 Cans of beer, 2 - 3 Shots of liquor per week  . Drug use: No  . Sexual activity: Yes   Other Topics Concern  . Not on file   Social History Narrative  . No narrative on file      Review of Systems  All other systems reviewed and are negative.      Objective:   Physical Exam  Neck: No JVD present.  Cardiovascular: Normal rate, regular rhythm and normal heart sounds.   No murmur heard. Pulmonary/Chest: Effort normal and breath  sounds normal. No respiratory distress. He has no wheezes. He has no rales.  Abdominal: Soft. Bowel sounds are normal. He exhibits no distension. There is no tenderness. There is no rebound.  Musculoskeletal: He exhibits no edema.  Vitals reviewed.         Assessment & Plan:  HTN (hypertension) with goal to be determined  Erectile dysfunction, unspecified erectile dysfunction type  I believe the patient has hypertension. I recommended starting medication to keep his systolic blood pressure less than 188 and his diastolic blood pressure less than 90. However the patient is very hesitant to start medication and he is confident that his blood pressure is better at home. He would like to try lifestyle changes first. I recommended abstinence from alcohol, a low-salt diet, 30 minutes a day of aerobic exercise 5 days a week, and 10-15 pounds weight loss. Recheck blood pressure in 6 months. If persistently elevated, I will start medication at that time. If patient sees the blood pressures consistently higher than 140/90 prior to that 6 month appointment, we can start medication sooner  Begin Viagra 50 mg by mouth 30 minutes prior to sexual activity for erectile dysfunction

## 2016-06-21 ENCOUNTER — Ambulatory Visit (HOSPITAL_COMMUNITY)
Admission: EM | Admit: 2016-06-21 | Discharge: 2016-06-21 | Disposition: A | Discharge status: Home / Self Care | Payer: PRIVATE HEALTH INSURANCE | Attending: Internal Medicine | Admitting: Internal Medicine

## 2016-06-21 ENCOUNTER — Encounter (HOSPITAL_COMMUNITY): Payer: Self-pay | Admitting: Emergency Medicine

## 2016-06-21 DIAGNOSIS — L738 Other specified follicular disorders: Secondary | ICD-10-CM

## 2016-06-21 MED ORDER — DOXYCYCLINE HYCLATE 100 MG PO CAPS: 100.0000 mg | ORAL_CAPSULE | Freq: Two times a day (BID) | ORAL | 0 refills | Status: DC

## 2016-06-21 NOTE — ED Provider Notes (Signed)
CSN: [id number]     Arrival date & time 06/21/16  1833 History   None    Chief Complaint  Patient presents with  . Rash   (Consider location/radiation/quality/duration/timing/severity/associated sxs/prior Treatment) Patient c/o rash / carbuncles on face.   The history is provided by the patient.  Rash  Location:  Face Facial rash location:  Face Quality: blistering and redness   Severity:  Moderate Onset quality:  Sudden   Past Medical History:  Diagnosis Date  . Hyperlipemia   . Hypertension    borderline   History reviewed. No pertinent surgical history. Family History  Problem Relation Age of Onset  . Hypertension Mother   . Stroke Mother   . Hyperlipidemia Brother   . Hypertension Brother   . Stroke Maternal Grandmother   . Stroke Maternal Grandfather    Social History  Substance Use Topics  . Smoking status: Never Smoker  . Smokeless tobacco: Never Used  . Alcohol use 3.6 - 4.2 oz/week    4 Cans of beer, 2 - 3 Shots of liquor per week    Review of Systems  Constitutional: Negative.   HENT: Negative.   Eyes: Negative.   Respiratory: Negative.   Cardiovascular: Negative.   Gastrointestinal: Negative.   Endocrine: Negative.   Genitourinary: Negative.   Musculoskeletal: Negative.   Skin: Positive for rash.  Allergic/Immunologic: Negative.   Neurological: Negative.   Hematological: Negative.   Psychiatric/Behavioral: Negative.     Allergies  Patient has no known allergies.  Home Medications   Prior to Admission medications   Medication Sig Start Date End Date Taking? Authorizing Provider  atorvastatin (LIPITOR) 20 MG tablet TAKE 1 TABLET (20 MG TOTAL) BY MOUTH DAILY AT 6 PM. Patient not taking: Reported on 04/03/2016 03/04/15   Susy Frizzle, MD  doxycycline (VIBRAMYCIN) 100 MG capsule Take 1 capsule (100 mg total) by mouth 2 (two) times daily. 06/21/16   Lysbeth Penner, FNP   Meds Ordered and Administered this Visit  Medications - No data to  display  BP (!) 155/93 (BP Location: Right Arm)   Pulse 78   Temp 98.2 F (36.8 C) (Oral)   Resp 20   SpO2 100%  No data found.   Physical Exam  Constitutional: He appears well-developed and well-nourished.  HENT:  Head: Normocephalic.  Right Ear: External ear normal.  Left Ear: External ear normal.  Mouth/Throat: Oropharynx is clear and moist.  Eyes: Conjunctivae and EOM are normal. Pupils are equal, round, and reactive to light.  Neck: Normal range of motion. Neck supple.  Cardiovascular: Normal rate, regular rhythm and normal heart sounds.   Pulmonary/Chest: Effort normal and breath sounds normal.  Skin:  Bilateral folliculitis barbae  Nursing note and vitals reviewed.   Urgent Care Course     Procedures (including critical care time)  Labs Review Labs Reviewed - No data to display  Imaging Review No results found.   Visual Acuity Review  Right Eye Distance:   Left Eye Distance:   Bilateral Distance:    Right Eye Near:   Left Eye Near:    Bilateral Near:         MDM   1. Folliculitis barbae    Doxycycline 100mg  one po bid x 10 days #20      Lysbeth Penner, FNP 06/21/16 2015

## 2016-06-21 NOTE — ED Triage Notes (Signed)
Pt c/o rash on right side of face onset 1 month associated w/pus drainage  Denies fevers, chills  A&O x4... NAD... Ambulatory

## 2016-08-01 ENCOUNTER — Telehealth: Payer: Self-pay | Admitting: Family Medicine

## 2016-08-01 NOTE — Telephone Encounter (Signed)
Patient calling to see if he can get his immunization record  [phone]

## 2016-08-01 NOTE — Telephone Encounter (Signed)
Immunization record left up front. LVM for patient that he can pick up

## 2016-08-06 ENCOUNTER — Encounter: Payer: Self-pay | Admitting: Family Medicine

## 2016-08-06 ENCOUNTER — Ambulatory Visit (INDEPENDENT_AMBULATORY_CARE_PROVIDER_SITE_OTHER): Payer: No Typology Code available for payment source | Admitting: Family Medicine

## 2016-08-06 VITALS — BP 156/110 | HR 74 | Temp 98.3°F | Resp 14 | Ht 67.0 in | Wt 207.0 lb

## 2016-08-06 DIAGNOSIS — L738 Other specified follicular disorders: Secondary | ICD-10-CM

## 2016-08-06 DIAGNOSIS — I10 Essential (primary) hypertension: Secondary | ICD-10-CM | POA: Diagnosis not present

## 2016-08-06 MED ORDER — SULFAMETHOXAZOLE-TRIMETHOPRIM 800-160 MG PO TABS: 1.0000 | ORAL_TABLET | Freq: Two times a day (BID) | ORAL | 0 refills | Status: DC

## 2016-08-06 MED ORDER — LOSARTAN POTASSIUM-HCTZ 50-12.5 MG PO TABS: 1.0000 | ORAL_TABLET | Freq: Every day | ORAL | 3 refills | Status: DC

## 2016-08-06 NOTE — Progress Notes (Signed)
   Subjective:    Patient ID: Leonard Schmitt, male    DOB: [birth year], 41 y.o.   MRN: [id number]  HPI Please see my previous office visits. Patient's blood pressure remains elevated. His average blood pressure last few weeks has been 150-170/80-100. This was even checked at work by his nurse there. He is otherwise asymptomatic and denies any chest pain shortness of breath or dyspnea on exertion. He also has 3 large erythematous indurated pustules coalescing into a plaque on his right jawline in the hair of his beard. Total diameter of this is approximately 2 cm x 3 cm.  appears to be folliculitis barbae Past Medical History:  Diagnosis Date  . Hyperlipemia   . Hypertension    borderline   No past surgical history on file. No current outpatient prescriptions on file prior to visit.   No current facility-administered medications on file prior to visit.    No Known Allergies Social History   Social History  . Marital status: Married    Spouse name: N/A  . Number of children: N/A  . Years of education: Masters   Occupational History  . [place] History Main Topics  . Smoking status: Never Smoker  . Smokeless tobacco: Never Used  . Alcohol use 3.6 - 4.2 oz/week    4 Cans of beer, 2 - 3 Shots of liquor per week  . Drug use: No  . Sexual activity: Yes   Other Topics Concern  . Not on file   Social History Narrative  . No narrative on file      Review of Systems  All other systems reviewed and are negative.      Objective:   Physical Exam  HENT:  Head:    Neck: No JVD present.  Cardiovascular: Normal rate, regular rhythm and normal heart sounds.   No murmur heard. Pulmonary/Chest: Effort normal and breath sounds normal. No respiratory distress. He has no wheezes. He has no rales.  Abdominal: Soft. Bowel sounds are normal. He exhibits no distension. There is no tenderness. There is no rebound.  Musculoskeletal: He exhibits no edema.  Skin:  Lesion and rash noted. Rash is pustular. There is erythema.  Vitals reviewed.         Assessment & Plan:  Benign essential HTN - Plan: losartan-hydrochlorothiazide (HYZAAR) 77-82.4 MG tablet  Folliculitis barbae Treat the folliculitis with Bactrim double strength tablets 1 by mouth twice a day for 1 week and then reassess. Begin the patient on Hyzaar 50/12.5 one by mouth daily and recheck blood pressure in 2 weeks

## 2016-08-20 ENCOUNTER — Ambulatory Visit: Payer: No Typology Code available for payment source | Admitting: Family Medicine

## 2016-10-16 ENCOUNTER — Encounter: Payer: Self-pay | Admitting: Family Medicine

## 2016-10-16 ENCOUNTER — Ambulatory Visit (INDEPENDENT_AMBULATORY_CARE_PROVIDER_SITE_OTHER): Payer: 59 | Admitting: Family Medicine

## 2016-10-16 VITALS — BP 138/86 | HR 83 | Temp 97.7°F | Resp 16 | Ht 67.0 in | Wt 207.0 lb

## 2016-10-16 DIAGNOSIS — D485 Neoplasm of uncertain behavior of skin: Secondary | ICD-10-CM

## 2016-10-16 DIAGNOSIS — D239 Other benign neoplasm of skin, unspecified: Secondary | ICD-10-CM | POA: Diagnosis not present

## 2016-10-16 DIAGNOSIS — L738 Other specified follicular disorders: Secondary | ICD-10-CM

## 2016-10-16 NOTE — Progress Notes (Signed)
Subjective:    Patient ID: Leonard Schmitt, male    DOB: 04-09-1975, 41 y.o.   MRN: [id number]  Hypertension   08/06/16 Please see my previous office visits. Patient's blood pressure remains elevated. His average blood pressure last few weeks has been 150-170/80-100. This was even checked at work by his nurse there. He is otherwise asymptomatic and denies any chest pain shortness of breath or dyspnea on exertion. He also has 3 large erythematous indurated pustules coalescing into a plaque on his right jawline in the hair of his beard. Total diameter of this is approximately 2 cm x 3 cm.  appears to be folliculitis barbae.  AT that time, my plan was: Treat the folliculitis with Bactrim double strength tablets 1 by mouth twice a day for 1 week and then reassess. Begin the patient on Hyzaar 50/12.5 one by mouth daily and recheck blood pressure in 2 weeks  10/16/16 The rash on the right side of his jaw line is no better. Continues to have a 2 x 3 cm indurated plaque consisting of pustules and cysts that have coalesced into one continuous scarred plaque. It has not responded to Bactrim. They drained foul-smelling fluid. They do not appear to be an abscess although I am concerned about an underlying sinus tract that may require surgical excision.  He also has atypical lesion on his right hand just distal to the on the on the lateral aspect of his right wrist. It is a 6 mm hyperpigmented domed papule that he would like removed due to tenderness Past Medical History:  Diagnosis Date  . Hyperlipemia   . Hypertension    borderline   No past surgical history on file. Current Outpatient Prescriptions on File Prior to Visit  Medication Sig Dispense Refill  . losartan-hydrochlorothiazide (HYZAAR) 50-12.5 MG tablet Take 1 tablet by mouth daily. 90 tablet 3   No current facility-administered medications on file prior to visit.    No Known Allergies Social History   Social History  . Marital status: Married      Spouse name: N/A  . Number of children: N/A  . Years of education: Masters   Occupational History  . [employer] History Main Topics  . Smoking status: Never Smoker  . Smokeless tobacco: Never Used  . Alcohol use 3.6 - 4.2 oz/week    4 Cans of beer, 2 - 3 Shots of liquor per week  . Drug use: No  . Sexual activity: Yes   Other Topics Concern  . Not on file   Social History Narrative  . No narrative on file      Review of Systems  All other systems reviewed and are negative.      Objective:   Physical Exam  HENT:  Head:    Neck: No JVD present.  Cardiovascular: Normal rate, regular rhythm and normal heart sounds.   No murmur heard. Pulmonary/Chest: Effort normal and breath sounds normal. No respiratory distress. He has no wheezes. He has no rales.  Abdominal: Soft. Bowel sounds are normal. He exhibits no distension. There is no tenderness. There is no rebound.  Musculoskeletal: He exhibits no edema.  Skin: Lesion and rash noted. Rash is pustular. There is erythema.  Vitals reviewed.   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  I believe this will require surgical excision to remove the underlying sinus tract. However prior to consult in general surgery/plastic surgery, I will consult dermatology to see if they can offer any other assistance. Plaque is been present now for almost a year and has failed conservative therapy. The lesion on the lateral aspect of his right wrist was anesthetized with 0.1% lidocaine with epinephrine. A shave biopsy was performed using sterile technique and the lesion was sent to pathology in a labeled container. Hemostasis was achieved with Drysol and a Band-Aid. Await pathology results

## 2016-10-18 LAB — TISSUE SPECIMEN

## 2016-10-18 LAB — PATHOLOGY

## 2016-10-31 ENCOUNTER — Telehealth: Payer: Self-pay | Admitting: Family Medicine

## 2016-10-31 NOTE — Telephone Encounter (Signed)
Wants refill of antibiotic for infection on his face.

## 2016-10-31 NOTE — Telephone Encounter (Signed)
Patient calling to see if can get a refill on his last antibiotic that doctor pickard wrote for him  [facility]

## 2016-11-01 MED ORDER — DOXYCYCLINE HYCLATE 100 MG PO CAPS: 100.0000 mg | ORAL_CAPSULE | Freq: Two times a day (BID) | ORAL | 0 refills | Status: DC

## 2016-11-01 NOTE — Telephone Encounter (Signed)
Medication called/sent to requested pharmacy and pt aware 

## 2016-11-01 NOTE — Telephone Encounter (Signed)
Ok with doxycycline 100 mg pobid for 14 days.

## 2016-11-29 ENCOUNTER — Encounter: Payer: Self-pay | Admitting: Family Medicine

## 2017-01-02 MED FILL — LOSARTAN-HCTZ 50-12.5 MG TA: 50-12.5 | 90 days supply | Qty: 90 | Fill #0

## 2017-01-29 ENCOUNTER — Encounter: Payer: Self-pay | Admitting: Family Medicine

## 2017-02-06 ENCOUNTER — Other Ambulatory Visit: Payer: Self-pay

## 2017-02-06 ENCOUNTER — Ambulatory Visit: Payer: 59 | Admitting: Physician Assistant

## 2017-02-06 ENCOUNTER — Encounter: Payer: Self-pay | Admitting: Physician Assistant

## 2017-02-06 VITALS — BP 140/84 | HR 104 | Temp 98.0°F | Resp 16 | Ht 67.0 in | Wt 210.8 lb

## 2017-02-06 DIAGNOSIS — M5431 Sciatica, right side: Secondary | ICD-10-CM

## 2017-02-06 MED ORDER — PREDNISONE 20 MG PO TABS: ORAL_TABLET | ORAL | 0 refills | Status: DC

## 2017-02-06 NOTE — Progress Notes (Signed)
Patient ID: Leonard Schmitt MRN: [id number], DOB: 17-Aug-1975, 42 y.o. Date of Encounter: 02/06/2017, 10:41 AM    Chief Complaint:  Chief Complaint  Patient presents with  . Back Pain    x2days      HPI: 42 y.o. year old male presents with above.   I reviewed his chart and saw that I see no office notes regarding back pain and see no x-rays of spine.  He reports that this is the first time he has had to come in for of visit regarding back pain.  Says that he would occasionally get some mild back pain that would "come and go ".  Says that-- with those times of mild pain-- sometimes it would be on his right low back, sometimes would be on the left.  However,states that for the past 2 days he has had significant pain in the right low back.  States that he could not put weight on his leg, could not stand without significant pain in the right low back.    He reports that this episode of pain started on Monday, 02/04/2017.  States that that morning when he woke up and tried to turn to get up is when he felt significant pain in the right low back.  Had to lay back down in the bed.  States that after a while he needed to go to the bathroom and had to get his wife to help him because when he tried to stand and bear weight he had severe pain in the right low back.  Points to area of right sacroiliac joint/right sciatic notch as area of pain.  He has had no pain, no numbness or tingling no weakness down either leg.  No incontinence of bowel or bladder.  He states that on Monday he stayed in bed most of the day except for the only time he got up at all was to go to the bathroom. Reports that on Tuesday which was yesterday he walked around the house some but mostly stayed on the couch. Reports that this morning he has been been moving around more and looked up exercise/stretch to do and did "figure 4 stretch ".  States that that did seem to help.  Already was scheduled to be off work Monday Tuesday  Wednesday but then is scheduled to work Thursday Friday and then is off again Saturday Sunday.  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  States that he does a lot of walking and standing at work.  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  Asked if he does any other exercise or any other activity that would affect his back--- he states that he walks in the neighborhood some and at home will do push ups, pull ups and also does woodworking at home.    Home Meds:   Outpatient Medications Prior to Visit  Medication Sig Dispense Refill  . doxycycline (VIBRAMYCIN) 100 MG capsule Take 1 capsule (100 mg total) by mouth 2 (two) times daily. 28 capsule 0  . losartan-hydrochlorothiazide (HYZAAR) 50-12.5 MG tablet Take 1 tablet by mouth daily. 90 tablet 3   No facility-administered medications prior to visit.     Allergies: No Known Allergies    Review of Systems: See HPI for pertinent ROS. All other ROS negative.    Physical Exam: Blood pressure 140/84, pulse (!) 104, temperature 98 F (36.7 C), temperature source Oral, resp. rate 16, height 5'  7" (1.702 m), weight 95.6 kg (210 lb 12.8 oz), SpO2 98 %., Body mass index is 33.02 kg/m. General:  WNWD AAM. Appears in no acute distress. Neck: Supple. No thyromegaly. No lymphadenopathy. Lungs: Clear bilaterally to auscultation without wheezes, rales, or rhonchi. Breathing is unlabored. Heart: Regular rhythm. No murmurs, rubs, or gallops. Msk:  Strength and tone normal for age. Positive tenderness with palpation of  Right SacroIliac Joint, Sciatic Notch region.  There is no tenderness with palpation of the low back up in the lumbar region.  Extremities/Skin: Warm and dry.  Neuro: Alert and oriented X 3. Moves all extremities spontaneously. Gait is normal. CNII-XII grossly in tact. Psych:  Responds to questions appropriately with a normal affect.     ASSESSMENT AND PLAN:  42  y.o. year old male with  1. Right sciatic notch pain Discussed different treatment options.  He does not want to use any muscle relaxer.  Discussed applying heat to the area using heating pad or warm water in the shower.  Discussed using anti-inflammatory.  Recommend a prednisone taper to treat the inflammation around the nerve that is causing his acute pain at this time.  Also discussed that stretching on a routine basis will be the mainstay of therapy.  Discussed specific stretches for him to do on a routine basis.  Gave note to be out of work tomorrow and he will use this note if needed if he is not significantly improved by then.  Take the prednisone taper as directed.  Do the stretches and apply heat.  Follow-up if symptoms not resolved/controlled upon completion of the prednisone taper. - predniSONE (DELTASONE) 20 MG tablet; Take 3 daily for 2 days, then 2 daily for 2 days, then 1 daily for 2 days.  Dispense: 12 tablet; Refill: 0   Signed, [address] [place], [place], [facility] 02/06/2017 10:41 AM

## 2017-03-06 DIAGNOSIS — L720 Epidermal cyst: Secondary | ICD-10-CM | POA: Diagnosis not present

## 2017-03-06 DIAGNOSIS — L700 Acne vulgaris: Secondary | ICD-10-CM | POA: Diagnosis not present

## 2017-03-20 DIAGNOSIS — L720 Epidermal cyst: Secondary | ICD-10-CM | POA: Diagnosis not present

## 2017-05-20 MED FILL — LOSARTAN-HCTZ 50-12.5 MG TA: 50-12.5 | 90 days supply | Qty: 90 | Fill #1

## 2017-10-08 MED FILL — LOSARTAN-HCTZ 50-12.5 MG TA: 50-12.5 | 30 days supply | Qty: 30 | Fill #0

## 2017-10-18 ENCOUNTER — Other Ambulatory Visit: Payer: Self-pay | Admitting: Family Medicine

## 2017-10-18 DIAGNOSIS — I10 Essential (primary) hypertension: Secondary | ICD-10-CM

## 2017-10-18 MED ORDER — LOSARTAN POTASSIUM-HCTZ 50-12.5 MG PO TABS
1.0000 | ORAL_TABLET | Freq: Every day | ORAL | 0 refills | Status: DC
Start: 2017-10-18 — End: 2018-12-04

## 2018-02-19 DIAGNOSIS — H5213 Myopia, bilateral: Secondary | ICD-10-CM | POA: Diagnosis not present

## 2018-12-04 ENCOUNTER — Other Ambulatory Visit: Payer: Self-pay

## 2018-12-04 DIAGNOSIS — I10 Essential (primary) hypertension: Secondary | ICD-10-CM

## 2018-12-04 MED ORDER — LOSARTAN POTASSIUM-HCTZ 50-12.5 MG PO TABS: 1.0000 | ORAL_TABLET | Freq: Every day | ORAL | 0 refills | Status: DC

## 2018-12-15 ENCOUNTER — Ambulatory Visit (HOSPITAL_COMMUNITY)
Admission: EM | Admit: 2018-12-15 | Discharge: 2018-12-15 | Disposition: A | Discharge status: Home / Self Care | Payer: 59 | Attending: Family Medicine | Admitting: Family Medicine

## 2018-12-15 ENCOUNTER — Other Ambulatory Visit: Payer: Self-pay

## 2018-12-15 ENCOUNTER — Encounter (HOSPITAL_COMMUNITY): Payer: Self-pay

## 2018-12-15 DIAGNOSIS — I10 Essential (primary) hypertension: Secondary | ICD-10-CM

## 2018-12-15 DIAGNOSIS — S61211A Laceration without foreign body of left index finger without damage to nail, initial encounter: Secondary | ICD-10-CM | POA: Diagnosis not present

## 2018-12-15 DIAGNOSIS — W268XXA Contact with other sharp object(s), not elsewhere classified, initial encounter: Secondary | ICD-10-CM | POA: Diagnosis not present

## 2018-12-15 MED ORDER — DOXYCYCLINE HYCLATE 100 MG PO CAPS: 100.0000 mg | ORAL_CAPSULE | Freq: Two times a day (BID) | ORAL | 0 refills | Status: DC

## 2018-12-15 MED FILL — DOXYCYCLINE HYC 100 MG CAPS: 100 | 14 days supply | Qty: 28 | Fill #0

## 2018-12-15 NOTE — ED Triage Notes (Signed)
Patient presents to Urgent Care with complaints of left hand laceration since 2 days ago. Patient reports he accidentally cut it while fishing, states the lady at the pier cleaned it up and taped it shut for him but it is still difficult for him to close his hand in a fist. Pt states tetanus is two years old he thinks.

## 2018-12-15 NOTE — Discharge Instructions (Addendum)
Your last tetanus was 2016  Avoid petroleum jelly products

## 2018-12-15 NOTE — ED Provider Notes (Signed)
[name]    CSN: [id number] Arrival date & time: 12/15/18  [id number]      History   Chief Complaint Chief Complaint  Patient presents with  . Hand Injury    HPI Leonard Schmitt is a 43 y.o. male.   43 year old man who is an established patient at [facility] urgent care who presents with laceration to his hand.  Patient presents to Urgent Care with complaints of left hand laceration since 2 days ago. Patient reports he accidentally cut it while fishing, states the lady at the pier cleaned it up and taped it shut for him but it is still difficult for him to close his hand in a fist. Pt states tetanus is two years old he thinks.   Last Tdap 2016     Past Medical History:  Diagnosis Date  . Hyperlipemia   . Hypertension    borderline    Patient Active Problem List   Diagnosis Date Noted  . Hypertension     History reviewed. No pertinent surgical history.     Home Medications    Prior to Admission medications   Medication Sig Start Date End Date Taking? Authorizing Provider  doxycycline (VIBRAMYCIN) 100 MG capsule Take 1 capsule (100 mg total) by mouth 2 (two) times daily. 12/15/18   Robyn Haber, MD  losartan-hydrochlorothiazide (HYZAAR) 50-12.5 MG tablet Take 1 tablet by mouth daily. 12/04/18   Susy Frizzle, MD    Family History Family History  Problem Relation Age of Onset  . Hypertension Mother   . Stroke Mother   . Hyperlipidemia Brother   . Hypertension Brother   . Stroke Maternal Grandmother   . Stroke Maternal Grandfather     Social History Social History   Tobacco Use  . Smoking status: Never Smoker  . Smokeless tobacco: Never Used  Substance Use Topics  . Alcohol use: Yes    Alcohol/week: 6.0 - 7.0 standard drinks    Types: 4 Cans of beer, 2 - 3 Shots of liquor per week  . Drug use: No     Allergies   Patient has no known allergies.   Review of Systems Review of Systems   Physical Exam Triage Vital Signs ED  Triage Vitals  Enc Vitals Group     BP 12/15/18 0933 (!) 167/110     Pulse Rate 12/15/18 0933 79     Resp 12/15/18 0933 15     Temp 12/15/18 0933 98.3 F (36.8 C)     Temp Source 12/15/18 0933 Oral     SpO2 12/15/18 0933 98 %     Weight --      Height --      Head Circumference --      Peak Flow --      Pain Score 12/15/18 0932 3     Pain Loc --      Pain Edu? --      Excl. in [place]? --    No data found.  Updated Vital Signs BP (!) 167/110 (BP Location: Right Arm) Comment: pt has white coat syndrome  Pulse 79   Temp 98.3 F (36.8 C) (Oral)   Resp 15   SpO2 98%    Physical Exam Vitals signs and nursing note reviewed.  Constitutional:      Appearance: Normal appearance. He is normal weight.  Eyes:     Conjunctiva/sclera: Conjunctivae normal.  Neck:     Musculoskeletal: Normal range of motion and neck supple.  Cardiovascular:  Rate and Rhythm: Normal rate.  Pulmonary:     Effort: Pulmonary effort is normal.  Musculoskeletal: Normal range of motion.        General: Swelling, tenderness and signs of injury present. No deformity.     Comments: Patient has some pain with flexion of the finger but is able to move it in all directions  Skin:    General: Skin is warm and dry.     Comments: 1 cm laceration left medial proximal index finger just lateral to the MCP joint  Neurological:     General: No focal deficit present.     Mental Status: He is alert.     Cranial Nerves: No cranial nerve deficit.     Coordination: Coordination normal.  Psychiatric:        Mood and Affect: Mood normal.        Thought Content: Thought content normal.      UC Treatments / Results  Labs (all labs ordered are listed, but only abnormal results are displayed) Labs Reviewed - No data to display  EKG   Radiology No results found.  Procedures Laceration Repair  Date/Time: 12/15/2018 10:09 AM Performed by: Robyn Haber, MD Authorized by: Robyn Haber, MD   Consent:     Consent obtained:  Verbal   Consent given by:  Patient   Risks discussed:  Infection, pain, tendon damage and vascular damage   Alternatives discussed:  No treatment Anesthesia (see MAR for exact dosages):    Anesthesia method:  None Laceration details:    Location:  Finger   Finger location:  L index finger Repair type:    Repair type:  Simple Treatment:    Area cleansed with:  Soap and water   Amount of cleaning:  Standard   Visualized foreign bodies/material removed: no   Skin repair:    Repair method:  Tissue adhesive Approximation:    Approximation:  Close Post-procedure details:    Dressing:  Bulky dressing   Patient tolerance of procedure:  Tolerated well, no immediate complications   (including critical care time)  Medications Ordered in UC Medications - No data to display  Initial Impression / Assessment and Plan / UC Course  I have reviewed the triage vital signs and the nursing notes.  Pertinent labs & imaging results that were available during my care of the patient were reviewed by me and considered in my medical decision making (see chart for details).    Final Clinical Impressions(s) / UC Diagnoses   Final diagnoses:  Laceration of left index finger without foreign body without damage to nail, initial encounter     Discharge Instructions     Your last tetanus was 2016  Avoid petroleum jelly products    ED Prescriptions    Medication Sig Dispense Auth. Provider   doxycycline (VIBRAMYCIN) 100 MG capsule Take 1 capsule (100 mg total) by mouth 2 (two) times daily. 28 capsule Robyn Haber, MD     PDMP not reviewed this encounter.   Robyn Haber, MD 12/15/18 1010

## 2018-12-18 ENCOUNTER — Ambulatory Visit (INDEPENDENT_AMBULATORY_CARE_PROVIDER_SITE_OTHER): Payer: 59 | Admitting: Family Medicine

## 2018-12-18 ENCOUNTER — Encounter: Payer: Self-pay | Admitting: Family Medicine

## 2018-12-18 ENCOUNTER — Other Ambulatory Visit: Payer: Self-pay

## 2018-12-18 VITALS — BP 154/88 | HR 88 | Temp 96.5°F | Resp 16 | Ht 67.0 in | Wt 197.0 lb

## 2018-12-18 DIAGNOSIS — I10 Essential (primary) hypertension: Secondary | ICD-10-CM | POA: Diagnosis not present

## 2018-12-18 NOTE — Progress Notes (Signed)
Subjective:    Patient ID: Leonard Schmitt, male    DOB: 12-Feb-1975, 43 y.o.   MRN: [id number]  Patient has not been seen in more than 2 years.  He has been intermittently taking his blood pressure medication although he states that he is taking it consistently over the last few weeks.  His blood pressure today is elevated 154/88.  He has not been checking his blood pressure at home.  He is also not taking his cholesterol medication.  He is not smoking.  He works at the hospital at [employer].  He denies any chest pain shortness of breath or dyspnea on exertion Past Medical History:  Diagnosis Date  . Hyperlipemia   . Hypertension    borderline   No past surgical history on file. Current Outpatient Medications on File Prior to Visit  Medication Sig Dispense Refill  . losartan-hydrochlorothiazide (HYZAAR) 50-12.5 MG tablet Take 1 tablet by mouth daily. 30 tablet 0   No current facility-administered medications on file prior to visit.    No Known Allergies Social History   Socioeconomic History  . Marital status: Married    Spouse name: Not on file  . Number of children: Not on file  . Years of education: Masters  . Highest education level: Not on file  Occupational History  . Occupation: [place]  . Financial resource strain: Not on file  . Food insecurity    Worry: Not on file    Inability: Not on file  . Transportation needs    Medical: Not on file    Non-medical: Not on file  Tobacco Use  . Smoking status: Never Smoker  . Smokeless tobacco: Never Used  Substance and Sexual Activity  . Alcohol use: Yes    Alcohol/week: 6.0 - 7.0 standard drinks    Types: 4 Cans of beer, 2 - 3 Shots of liquor per week  . Drug use: No  . Sexual activity: Yes  Lifestyle  . Physical activity    Days per week: Not on file    Minutes per session: Not on file  . Stress: Not on file  Relationships  . Social Herbalist on phone: Not on file     Gets together: Not on file    Attends religious service: Not on file    Active member of club or organization: Not on file    Attends meetings of clubs or organizations: Not on file    Relationship status: Not on file  . Intimate partner violence    Fear of current or ex partner: Not on file    Emotionally abused: Not on file    Physically abused: Not on file    Forced sexual activity: Not on file  Other Topics Concern  . Not on file  Social History Narrative  . Not on file      Review of Systems  All other systems reviewed and are negative.      Objective:   Physical Exam  Neck: No JVD present.  Cardiovascular: Normal rate, regular rhythm and normal heart sounds.  No murmur heard. Pulmonary/Chest: Effort normal and breath sounds normal. No respiratory distress. He has no wheezes. He has no rales.  Abdominal: Soft. Bowel sounds are normal. He exhibits no distension. There is no abdominal tenderness. There is no rebound.  Musculoskeletal:        General: No edema.  Vitals reviewed.       Assessment &  Plan:  Benign essential HTN - Plan: COMPLETE METABOLIC PANEL WITH GFR, CBC with Differential, Lipid Panel Patient's blood pressure today is not adequately controlled.  Patient would like to check his blood pressure at home and at work over the next week and record the values.  If consistently greater than 140/90, I would recommend increasing his Hyzaar to 100/12.5 daily.  Of also recommended that he return fasting at his earliest convenience for CMP, CBC, and fasting lipid panel.

## 2019-01-01 ENCOUNTER — Other Ambulatory Visit: Payer: 59

## 2019-01-01 DIAGNOSIS — I10 Essential (primary) hypertension: Secondary | ICD-10-CM | POA: Diagnosis not present

## 2019-01-02 ENCOUNTER — Other Ambulatory Visit: Payer: Self-pay

## 2019-01-02 DIAGNOSIS — D582 Other hemoglobinopathies: Secondary | ICD-10-CM

## 2019-01-02 DIAGNOSIS — D45 Polycythemia vera: Secondary | ICD-10-CM

## 2019-01-02 LAB — COMPLETE METABOLIC PANEL WITH GFR
AG Ratio: 1.5 (calc) (ref 1.0–2.5)
ALT: 25 U/L (ref 9–46)
AST: 19 U/L (ref 10–40)
Albumin: 4.5 g/dL (ref 3.6–5.1)
Alkaline phosphatase (APISO): 78 U/L (ref 36–130)
BUN: 11 mg/dL (ref 7–25)
CO2: 25 mmol/L (ref 20–32)
Calcium: 9.9 mg/dL (ref 8.6–10.3)
Chloride: 102 mmol/L (ref 98–110)
Creat: 1.32 mg/dL (ref 0.60–1.35)
GFR, Est African American: 75 mL/min/{1.73_m2} (ref >=60)
GFR, Est Non African American: 65 mL/min/{1.73_m2} (ref >=60)
Globulin: 3 g/dL (calc) (ref 1.9–3.7)
Glucose, Bld: 95 mg/dL (ref 65–99)
Potassium: 4.5 mmol/L (ref 3.5–5.3)
Sodium: 143 mmol/L (ref 135–146)
Total Bilirubin: 0.9 mg/dL (ref 0.2–1.2)
Total Protein: 7.5 g/dL (ref 6.1–8.1)

## 2019-01-02 LAB — CBC WITH DIFFERENTIAL/PLATELET
Absolute Monocytes: 592 cells/uL (ref 200–950)
Basophils Absolute: 39 cells/uL (ref 0–200)
Basophils Relative: 0.6 %
Eosinophils Absolute: 182 cells/uL (ref 15–500)
Eosinophils Relative: 2.8 %
HCT: 55.2 % — ABNORMAL HIGH (ref 38.5–50.0)
Hemoglobin: 18.2 g/dL — ABNORMAL HIGH (ref 13.2–17.1)
Lymphs Abs: 1690 cells/uL (ref 850–3900)
MCH: 27.5 pg (ref 27.0–33.0)
MCHC: 33 g/dL (ref 32.0–36.0)
MCV: 83.4 fL (ref 80.0–100.0)
MPV: 10.7 fL (ref 7.5–12.5)
Monocytes Relative: 9.1 %
Neutro Abs: 3998 cells/uL (ref 1500–7800)
Neutrophils Relative %: 61.5 %
Platelets: 252 10*3/uL (ref 140–400)
RBC: 6.62 10*6/uL — ABNORMAL HIGH (ref 4.20–5.80)
RDW: 13.5 % (ref 11.0–15.0)
Total Lymphocyte: 26 %
WBC: 6.5 10*3/uL (ref 3.8–10.8)

## 2019-01-02 LAB — LIPID PANEL
Cholesterol: 286 mg/dL — ABNORMAL HIGH (ref <200)
HDL: 49 mg/dL (ref >=40)
LDL Cholesterol (Calc): 209 mg/dL (calc) — ABNORMAL HIGH
Non-HDL Cholesterol (Calc): 237 mg/dL (calc) — ABNORMAL HIGH (ref <130)
Total CHOL/HDL Ratio: 5.8 (calc) — ABNORMAL HIGH (ref <5.0)
Triglycerides: 130 mg/dL (ref <150)

## 2019-01-02 MED ORDER — ATORVASTATIN CALCIUM 40 MG PO TABS: 40.0000 mg | ORAL_TABLET | Freq: Every day | ORAL | 2 refills | Status: DC

## 2019-01-06 ENCOUNTER — Inpatient Hospital Stay: Payer: 59 | Admitting: Oncology

## 2019-01-13 ENCOUNTER — Other Ambulatory Visit: Payer: Self-pay

## 2019-01-13 ENCOUNTER — Encounter: Payer: Self-pay | Admitting: Oncology

## 2019-01-13 ENCOUNTER — Inpatient Hospital Stay: Payer: 59 | Attending: Oncology | Admitting: Oncology

## 2019-01-13 ENCOUNTER — Other Ambulatory Visit: Payer: Self-pay | Admitting: Family Medicine

## 2019-01-13 ENCOUNTER — Inpatient Hospital Stay: Payer: 59

## 2019-01-13 VITALS — BP 145/97 | HR 73 | Temp 96.6°F | Resp 18 | Ht 67.0 in | Wt 200.1 lb

## 2019-01-13 DIAGNOSIS — I10 Essential (primary) hypertension: Secondary | ICD-10-CM | POA: Diagnosis not present

## 2019-01-13 DIAGNOSIS — D751 Secondary polycythemia: Secondary | ICD-10-CM | POA: Insufficient documentation

## 2019-01-13 LAB — CBC WITH DIFFERENTIAL/PLATELET
Abs Immature Granulocytes: 0.02 10*3/uL (ref 0.00–0.07)
Basophils Absolute: 0 10*3/uL (ref 0.0–0.1)
Basophils Relative: 1 %
Eosinophils Absolute: 0.2 10*3/uL (ref 0.0–0.5)
Eosinophils Relative: 2 %
HCT: 49.9 % (ref 39.0–52.0)
Hemoglobin: 15.9 g/dL (ref 13.0–17.0)
Immature Granulocytes: 0 %
Lymphocytes Relative: 22 %
Lymphs Abs: 1.7 10*3/uL (ref 0.7–4.0)
MCH: 27.2 pg (ref 26.0–34.0)
MCHC: 31.9 g/dL (ref 30.0–36.0)
MCV: 85.3 fL (ref 80.0–100.0)
Monocytes Absolute: 0.5 10*3/uL (ref 0.1–1.0)
Monocytes Relative: 7 %
Neutro Abs: 5.1 10*3/uL (ref 1.7–7.7)
Neutrophils Relative %: 68 %
Platelets: 227 10*3/uL (ref 150–400)
RBC: 5.85 MIL/uL — ABNORMAL HIGH (ref 4.22–5.81)
RDW: 13.2 % (ref 11.5–15.5)
WBC: 7.6 10*3/uL (ref 4.0–10.5)
nRBC: 0 % (ref 0.0–0.2)

## 2019-01-13 NOTE — Progress Notes (Signed)
Hematology/Oncology Consult note [facility] Telephone:([phone] [phone]   Patient Care Team: Susy Frizzle, MD as PCP - General (Family Medicine)  REFERRING PROVIDER: Susy Frizzle, MD  CHIEF COMPLAINTS/REASON FOR VISIT:  Evaluation of polycytosis  HISTORY OF PRESENTING ILLNESS:  Leonard Schmitt is a 43 y.o. male who was seen in consultation at the request of Susy Frizzle, MD for evaluation of polycytosis/erythrocytosis Reviewed patient's recent lab work which was obtain by PCP.  01/01/2019 labs showed elevated hemoglobin at 18.2.  Cell count 6.5, platelet 252 Reviewed patient previous medical records.  He does not have too much old lab work. 11/17/2024 hemoglobin was 16.7.  05/04/2013, hemoglobin 17.7.  Associated signs or symptoms: Denies weight loss, fever, chills, fatigue, night sweats.   Context:  Smoking history: Never smoker. Testosterone supplements: Denies  history of blood clots: Denies Daytime somnolence: Denies Family history of polycythemia: Denies  Patient provided history of snoring at night and sometimes stop breathing for a few seconds.  His wife has to wake him up. 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 Review of Systems  Constitutional: Negative for appetite change, chills, fatigue, fever and unexpected weight change.  HENT:   Negative for hearing loss and voice change.   Eyes: Negative for eye problems and icterus.  Respiratory: Negative for chest tightness, cough and shortness of breath.   Cardiovascular: Negative for chest pain and leg swelling.  Gastrointestinal: Negative for abdominal distention and abdominal pain.  Endocrine: Negative for hot flashes.  Genitourinary: Negative for difficulty urinating, dysuria and frequency.   Musculoskeletal: Negative for arthralgias.  Skin: Negative for itching and rash.  Neurological: Negative for light-headedness and numbness.  Hematological:  Negative for adenopathy. Does not bruise/bleed easily.  Psychiatric/Behavioral: Negative for confusion.    MEDICAL HISTORY:  Past Medical History:  Diagnosis Date  . Hyperlipemia   . Hypertension    borderline    SURGICAL HISTORY: History reviewed. No pertinent surgical history.  SOCIAL HISTORY: Social History   Socioeconomic History  . Marital status: Married    Spouse name: Not on file  . Number of children: Not on file  . Years of education: Masters  . Highest education level: Not on file  Occupational History  . Occupation:  behavior health  Tobacco Use  . Smoking status: Never Smoker  . Smokeless tobacco: Never Used  Substance and Sexual Activity  . Alcohol use: Yes    Alcohol/week: 6.0 - 7.0 standard drinks    Types: 4 Cans of beer, 2 - 3 Shots of liquor per week    Comment: 1-2 beer every other day  . Drug use: No  . Sexual activity: Yes  Other Topics Concern  . Not on file  Social History Narrative  . Not on file   Social Determinants of Health   Financial Resource Strain:   . Difficulty of Paying Living Expenses: Not on file  Food Insecurity:   . Worried About Charity fundraiser in the Last Year: Not on file  . Ran Out of Food in the Last Year: Not on file  Transportation Needs:   . Lack of Transportation (Medical): Not on file  . Lack of Transportation (Non-Medical): Not on file  Physical Activity:   . Days of Exercise per Week: Not on file  . Minutes of Exercise per Session: Not on file  Stress:   . Feeling of Stress : Not on file  Social Connections:   . Frequency of  Communication with Friends and Family: Not on file  . Frequency of Social Gatherings with Friends and Family: Not on file  . Attends Religious Services: Not on file  . Active Member of Clubs or Organizations: Not on file  . Attends Archivist Meetings: Not on file  . Marital Status: Not on file  Intimate Partner Violence:   . Fear of Current or Ex-Partner:  Not on file  . Emotionally Abused: Not on file  . Physically Abused: Not on file  . Sexually Abused: Not on file    FAMILY HISTORY: Family History  Problem Relation Age of Onset  . Hypertension Mother   . Stroke Mother   . Hyperlipidemia Brother   . Hypertension Brother   . Stroke Maternal Grandmother   . Stroke Maternal Grandfather     ALLERGIES:  has No Known Allergies.  MEDICATIONS:  Current Outpatient Medications  Medication Sig Dispense Refill  . atorvastatin (LIPITOR) 40 MG tablet Take 1 tablet (40 mg total) by mouth daily. 30 tablet 2  . losartan-hydrochlorothiazide (HYZAAR) 50-12.5 MG tablet TAKE 1 TABLET BY MOUTH DAILY. 30 tablet 0   No current facility-administered medications for this visit.     PHYSICAL EXAMINATION: ECOG PERFORMANCE STATUS: 0 - Asymptomatic Vitals:   01/13/19 1118  BP: (!) 145/97  Pulse: 73  Resp: 18  Temp: (!) 96.6 F (35.9 C)   Filed Weights   01/13/19 1118  Weight: 200 lb 1.6 oz (90.8 kg)    Physical Exam Constitutional:      General: He is not in acute distress. HENT:     Head: Normocephalic and atraumatic.  Eyes:     General: No scleral icterus.    Pupils: Pupils are equal, round, and reactive to light.  Cardiovascular:     Rate and Rhythm: Normal rate and regular rhythm.     Heart sounds: Normal heart sounds.  Pulmonary:     Effort: Pulmonary effort is normal. No respiratory distress.     Breath sounds: No wheezing.  Abdominal:     General: Bowel sounds are normal. There is no distension.     Palpations: Abdomen is soft. There is no mass.     Tenderness: There is no abdominal tenderness.  Musculoskeletal:        General: No deformity. Normal range of motion.     Cervical back: Normal range of motion and neck supple.  Skin:    General: Skin is warm and dry.     Findings: No erythema or rash.  Neurological:     Mental Status: He is alert and oriented to person, place, and time.     Cranial Nerves: No cranial nerve  deficit.     Coordination: Coordination normal.  Psychiatric:        Behavior: Behavior normal.        Thought Content: Thought content normal.     RADIOGRAPHIC STUDIES: I have personally reviewed the radiological images as listed and agreed with the findings in the report. No results found.   LABORATORY DATA:  I have reviewed the data as listed Lab Results  Component Value Date   WBC 7.6 01/13/2019   HGB 15.9 01/13/2019   HCT 49.9 01/13/2019   MCV 85.3 01/13/2019   PLT 227 01/13/2019   Recent Labs    01/01/19 0953  NA 143  K 4.5  CL 102  CO2 25  GLUCOSE 95  BUN 11  CREATININE 1.32  CALCIUM 9.9  GFRNONAA 65  GFRAA  75  PROT 7.5  AST 19  ALT 25  BILITOT 0.9   Iron/TIBC/Ferritin/ %Sat No results found for: IRON, TIBC, FERRITIN, IRONPCTSAT      ASSESSMENT & PLAN:  1. Erythrocytosis    Labs are reviewed and discussed with patient. Polycythemia (erythrocytosis) is an abnormal elevation of hemoglobin (Hgb) and/or hematocrit (Hct) in peripheral blood, and this can be caused by primary etiology, ie bone marrow mutation, or secondary etiology, ie hypoxia, smoking, androgen supplements, etc. patient reports history of snoring and occasionally stopping breathing, undiagnosed sleep apnea may contribute to erythrocytosis. I will obtain erythropoietin, , rule out primary etiology, JAK2 with reflex to other mutations, Repeat CBC to confirm erythrocytosis. Patient will follow up with me in 3 weeks to go over blood work and further management plan.  Labs are reviewed.  CBC showed hemoglobin 15.9, hematocrit 49.9.  Other labs are pending.  Orders Placed This Encounter  Procedures  . CBC with Differential/Platelet    Standing Status:   Future    Number of Occurrences:   1    Standing Expiration Date:   01/13/2020  . JAK2 V617F, w Reflex to CALR/E12/MPL    Standing Status:   Future    Number of Occurrences:   1    Standing Expiration Date:   01/13/2020  . Erythropoietin     Standing Status:   Future    Number of Occurrences:   1    Standing Expiration Date:   01/13/2020    We spent sufficient time to discuss many aspect of care, questions were answered to patient's satisfaction. The patient knows to call the clinic with any problems questions or concerns.  Cc Susy Frizzle, MD  Return of visit: 2-3 weeks Thank you for this kind referral and the opportunity to participate in the care of this patient. A copy of today's note is routed to referring provider  Total face to face encounter time for this patient visit was 45 min. >50% of the time was  spent in counseling and coordination of care.    Earlie Server, MD, PhD 01/13/2019

## 2019-01-14 LAB — ERYTHROPOIETIN: Erythropoietin: 16.7 m[IU]/mL (ref 2.6–18.5)

## 2019-01-22 LAB — CALR + JAK2 E12-15 + MPL (REFLEXED)

## 2019-01-22 LAB — JAK2 V617F, W REFLEX TO CALR/E12/MPL

## 2019-01-23 ENCOUNTER — Telehealth: Payer: Self-pay

## 2019-01-23 NOTE — Telephone Encounter (Signed)
Patient called in requesting MyChart setup - DOB/Address verified - assisted with MyChart setup, no further questions.

## 2019-02-03 ENCOUNTER — Inpatient Hospital Stay: Payer: 59 | Attending: Oncology | Admitting: Oncology

## 2019-02-03 ENCOUNTER — Encounter: Payer: Self-pay | Admitting: Oncology

## 2019-02-03 DIAGNOSIS — D751 Secondary polycythemia: Secondary | ICD-10-CM

## 2019-02-03 NOTE — Progress Notes (Signed)
Patient verified using two identifiers for virtual visit via telephone today.  Patient does not offer any problems today.  

## 2019-02-03 NOTE — Progress Notes (Signed)
HEMATOLOGY-ONCOLOGY TeleHEALTH VISIT PROGRESS NOTE  I connected with Leonard Schmitt on 02/03/19 at  2:15 PM EST by video enabled telemedicine visit and verified that I am speaking with the correct person using two identifiers. I discussed the limitations, risks, security and privacy concerns of performing an evaluation and management service by telemedicine and the availability of in-person appointments. I also discussed with the patient that there may be a patient responsible charge related to this service. The patient expressed understanding and agreed to proceed.   Other persons participating in the visit and their role in the encounter:  None  Patient's location: Home  Provider's location: office Chief Complaint: Erythrocytosis.   INTERVAL HISTORY Leonard Schmitt is a 44 y.o. male who has above history reviewed by me today presents for follow up visit for management of erythrocytosis Problems and complaints are listed below: Patient had blood work done during the interval.  Today he has no new complaints.I attempted to connect the patient for visual enabled telehealth visit .  Due to the technical difficulties with video,  Patient was transitioned to audio only visit.   Review of Systems  Constitutional: Positive for fatigue. Negative for appetite change, chills, fever and unexpected weight change.  HENT:   Negative for hearing loss and voice change.   Eyes: Negative for eye problems and icterus.  Respiratory: Negative for chest tightness, cough and shortness of breath.   Cardiovascular: Negative for chest pain and leg swelling.  Gastrointestinal: Negative for abdominal distention and abdominal pain.  Endocrine: Negative for hot flashes.  Genitourinary: Negative for difficulty urinating, dysuria and frequency.   Musculoskeletal: Negative for arthralgias.  Skin: Negative for itching and rash.  Neurological: Negative for light-headedness and numbness.  Hematological: Negative for adenopathy.  Does not bruise/bleed easily.  Psychiatric/Behavioral: Negative for confusion.    Past Medical History:  Diagnosis Date  . Hyperlipemia   . Hypertension    borderline   History reviewed. No pertinent surgical history.  Family History  Problem Relation Age of Onset  . Hypertension Mother   . Stroke Mother   . Hyperlipidemia Brother   . Hypertension Brother   . Stroke Maternal Grandmother   . Stroke Maternal Grandfather     Social History   Socioeconomic History  . Marital status: Married    Spouse name: Not on file  . Number of children: Not on file  . Years of education: Masters  . Highest education level: Not on file  Occupational History  . Occupation: [employer]  Tobacco Use  . Smoking status: Never Smoker  . Smokeless tobacco: Never Used  Substance and Sexual Activity  . Alcohol use: Yes    Alcohol/week: 6.0 - 7.0 standard drinks    Types: 4 Cans of beer, 2 - 3 Shots of liquor per week    Comment: 1-2 beer every other day  . Drug use: No  . Sexual activity: Yes  Other Topics Concern  . Not on file  Social History Narrative  . Not on file   Social Determinants of Health   Financial Resource Strain:   . Difficulty of Paying Living Expenses: Not on file  Food Insecurity:   . Worried About Charity fundraiser in the Last Year: Not on file  . Ran Out of Food in the Last Year: Not on file  Transportation Needs:   . Lack of Transportation (Medical): Not on file  . Lack of Transportation (Non-Medical): Not on file  Physical Activity:   .  Days of Exercise per Week: Not on file  . Minutes of Exercise per Session: Not on file  Stress:   . Feeling of Stress : Not on file  Social Connections:   . Frequency of Communication with Friends and Family: Not on file  . Frequency of Social Gatherings with Friends and Family: Not on file  . Attends Religious Services: Not on file  . Active Member of Clubs or Organizations: Not on file  . Attends Theatre manager Meetings: Not on file  . Marital Status: Not on file  Intimate Partner Violence:   . Fear of Current or Ex-Partner: Not on file  . Emotionally Abused: Not on file  . Physically Abused: Not on file  . Sexually Abused: Not on file    Current Outpatient Medications on File Prior to Visit  Medication Sig Dispense Refill  . atorvastatin (LIPITOR) 40 MG tablet Take 1 tablet (40 mg total) by mouth daily. 30 tablet 2  . losartan-hydrochlorothiazide (HYZAAR) 50-12.5 MG tablet TAKE 1 TABLET BY MOUTH DAILY. 30 tablet 0   No current facility-administered medications on file prior to visit.    No Known Allergies     Observations/Objective: Today's Vitals   02/03/19 1259  PainSc: 0-No pain   There is no height or weight on file to calculate BMI.  Physical Exam  CBC    Component Value Date/Time   WBC 7.6 01/13/2019 1146   RBC 5.85 (H) 01/13/2019 1146   HGB 15.9 01/13/2019 1146   HCT 49.9 01/13/2019 1146   PLT 227 01/13/2019 1146   MCV 85.3 01/13/2019 1146   MCH 27.2 01/13/2019 1146   MCHC 31.9 01/13/2019 1146   RDW 13.2 01/13/2019 1146   LYMPHSABS 1.7 01/13/2019 1146   MONOABS 0.5 01/13/2019 1146   EOSABS 0.2 01/13/2019 1146   BASOSABS 0.0 01/13/2019 1146    CMP     Component Value Date/Time   NA 143 01/01/2019 0953   K 4.5 01/01/2019 0953   CL 102 01/01/2019 0953   CO2 25 01/01/2019 0953   GLUCOSE 95 01/01/2019 0953   BUN 11 01/01/2019 0953   CREATININE 1.32 01/01/2019 0953   CALCIUM 9.9 01/01/2019 0953   PROT 7.5 01/01/2019 0953   ALBUMIN 4.4 11/18/2014 0900   AST 19 01/01/2019 0953   ALT 25 01/01/2019 0953   ALKPHOS 73 11/18/2014 0900   BILITOT 0.9 01/01/2019 0953   GFRNONAA 65 01/01/2019 0953   GFRAA 75 01/01/2019 0953     Assessment and Plan: 1. Erythrocytosis     Labs reviewed and discussed with patient.  Repeat CBC showed normalization of hemoglobin at 15.9.  Smear did not reveal any abnormal morphology. Jak2 V617F mutation with reflex to other  mutations are negative.  Normal erythropoietin and carbon monoxide level.  I discussed with patient that recent high hemoglobin count can be due to dehydration and hemoconcentration.  Reviewed his previous blood work, he had another episode of hemoglobin in 2015.  Recommend patient to repeat blood work in 6 months for follow-up.  If hemoglobin continues to be normal, will be discharged back to PCP for follow-up.  Follow Up Instructions: 6 months   I discussed the assessment and treatment plan with the patient. The patient was provided an opportunity to ask questions and all were answered. The patient agreed with the plan and demonstrated an understanding of the instructions.  The patient was advised to call back or seek an in-person evaluation if the symptoms worsen or if  the condition fails to improve as anticipated.    Earlie Server, MD 02/03/2019 6:46 PM

## 2019-02-16 ENCOUNTER — Other Ambulatory Visit: Payer: Self-pay | Admitting: Family Medicine

## 2019-02-16 DIAGNOSIS — I10 Essential (primary) hypertension: Secondary | ICD-10-CM

## 2019-04-02 ENCOUNTER — Other Ambulatory Visit: Payer: 59

## 2019-04-07 ENCOUNTER — Other Ambulatory Visit: Payer: 59

## 2019-04-07 ENCOUNTER — Other Ambulatory Visit: Payer: Self-pay | Admitting: Family Medicine

## 2019-04-07 DIAGNOSIS — E785 Hyperlipidemia, unspecified: Secondary | ICD-10-CM

## 2019-05-12 ENCOUNTER — Ambulatory Visit (HOSPITAL_COMMUNITY)
Admission: EM | Admit: 2019-05-12 | Discharge: 2019-05-12 | Disposition: A | Discharge status: Home / Self Care | Payer: 59 | Attending: Family Medicine | Admitting: Family Medicine

## 2019-05-12 ENCOUNTER — Other Ambulatory Visit: Payer: Self-pay

## 2019-05-12 ENCOUNTER — Encounter (HOSPITAL_COMMUNITY): Payer: Self-pay | Admitting: Emergency Medicine

## 2019-05-12 DIAGNOSIS — H1031 Unspecified acute conjunctivitis, right eye: Secondary | ICD-10-CM

## 2019-05-12 MED ORDER — FLUORESCEIN SODIUM 1 MG OP STRP
ORAL_STRIP | OPHTHALMIC | Status: AC
  Filled 2019-05-12: qty 1

## 2019-05-12 MED ORDER — MOXIFLOXACIN HCL 0.5 % OP SOLN: 1.0000 [drp] | Freq: Three times a day (TID) | OPHTHALMIC | 0 refills | Status: DC

## 2019-05-12 MED ORDER — TETRACAINE HCL 0.5 % OP SOLN
OPHTHALMIC | Status: AC
  Filled 2019-05-12: qty 4

## 2019-05-12 NOTE — ED Triage Notes (Signed)
PT woke up yesterday morning with pink, itchy eye with "crusty" drainage. He wears contacts. He reports pressure in right eye and a "film" over his vision.

## 2019-05-12 NOTE — Discharge Instructions (Addendum)
Use eye drops No contacts until the infection clears Follow up with eye doctor

## 2019-05-12 NOTE — ED Provider Notes (Signed)
[facility]    CSN: [id number] Arrival date & time: 05/12/19  1027      History   Chief Complaint Chief Complaint  Patient presents with  . Eye Problem    HPI Leonard Schmitt is a 44 y.o. male.   HPI  Patient is a contact lens wearer.  He woke up yesterday with pinkeye.  His eye was pink.  Irritated.  Crusty.  He had to soak up washcloth to get it open.  He has continued to wear contact lenses and has wanted right now.  He states that he feels a pressure sensation in his eye, grainy sensation, and when he blinks he is a film over his eye. No cough cold runny nose sore throat  Past Medical History:  Diagnosis Date  . Hyperlipemia   . Hypertension    borderline    Patient Active Problem List   Diagnosis Date Noted  . Hypertension     History reviewed. No pertinent surgical history.     Home Medications    Prior to Admission medications   Medication Sig Start Date End Date Taking? Authorizing Provider  atorvastatin (LIPITOR) 40 MG tablet Take 1 tablet (40 mg total) by mouth daily. 01/02/19  Yes Susy Frizzle, MD  losartan-hydrochlorothiazide (HYZAAR) 50-12.5 MG tablet TAKE 1 TABLET BY MOUTH DAILY. 02/16/19  Yes Susy Frizzle, MD  moxifloxacin (VIGAMOX) 0.5 % ophthalmic solution Place 1 drop into the right eye 3 (three) times daily. 05/12/19   Raylene Everts, MD    Family History Family History  Problem Relation Age of Onset  . Hypertension Mother   . Stroke Mother   . Hyperlipidemia Brother   . Hypertension Brother   . Stroke Maternal Grandmother   . Stroke Maternal Grandfather     Social History Social History   Tobacco Use  . Smoking status: Never Smoker  . Smokeless tobacco: Never Used  Substance Use Topics  . Alcohol use: Yes    Alcohol/week: 6.0 - 7.0 standard drinks    Types: 4 Cans of beer, 2 - 3 Shots of liquor per week    Comment: 1-2 beer every other day  . Drug use: No     Allergies   Patient has no known  allergies.   Review of Systems Review of Systems  Eyes: Positive for discharge, redness and itching. Negative for photophobia, pain and visual disturbance.     Physical Exam Triage Vital Signs ED Triage Vitals  Enc Vitals Group     BP 05/12/19 1108 (!) 159/87     Pulse Rate 05/12/19 1105 75     Resp 05/12/19 1105 16     Temp 05/12/19 1105 98.3 F (36.8 C)     Temp Source 05/12/19 1105 Oral     SpO2 05/12/19 1105 100 %     Weight --      Height --      Head Circumference --      Peak Flow --      Pain Score 05/12/19 1104 3     Pain Loc --      Pain Edu? --      Excl. in [place]? --    No data found.  Updated Vital Signs BP (!) 159/87   Pulse 75   Temp 98.3 F (36.8 C) (Oral)   Resp 16   SpO2 100%      Physical Exam Constitutional:      General: He is not in acute distress.  Appearance: He is well-developed and normal weight.  HENT:     Head: Normocephalic and atraumatic.     Mouth/Throat:     Comments: Mask is in place Eyes:     Pupils: Pupils are equal, round, and reactive to light.     Comments: Right eye is injected.  No crusting.  No tearing.  Normal pupillary response.  No photophobia.  Fluorescein exam is negative.  Cardiovascular:     Rate and Rhythm: Normal rate.  Pulmonary:     Effort: Pulmonary effort is normal. No respiratory distress.  Musculoskeletal:        General: Normal range of motion.     Cervical back: Normal range of motion.  Skin:    General: Skin is warm and dry.  Neurological:     Mental Status: He is alert.  Psychiatric:        Mood and Affect: Mood normal.        Behavior: Behavior normal.      UC Treatments / Results  Labs (all labs ordered are listed, but only abnormal results are displayed) Labs Reviewed - No data to display  EKG   Radiology No results found.  Procedures Procedures (including critical care time)  Medications Ordered in UC Medications - No data to display  Initial Impression / Assessment and  Plan / UC Course  I have reviewed the triage vital signs and the nursing notes.  Pertinent labs & imaging results that were available during my care of the patient were reviewed by me and considered in my medical decision making (see chart for details).     Patient is counseled not to wear contact lenses while he has eye infection.  Needs to follow-up with ophthalmology Final Clinical Impressions(s) / UC Diagnoses   Final diagnoses:  Acute bacterial conjunctivitis of right eye     Discharge Instructions     Use eye drops No contacts until the infection clears Follow up with eye doctor   ED Prescriptions    Medication Sig Dispense Auth. Provider   moxifloxacin (VIGAMOX) 0.5 % ophthalmic solution Place 1 drop into the right eye 3 (three) times daily. 3 mL Raylene Everts, MD     PDMP not reviewed this encounter.   Raylene Everts, MD 05/12/19 2102

## 2019-05-28 ENCOUNTER — Ambulatory Visit: Payer: 59 | Attending: Internal Medicine

## 2019-05-28 DIAGNOSIS — Z23 Encounter for immunization: Secondary | ICD-10-CM
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
## 2019-06-22 ENCOUNTER — Ambulatory Visit: Payer: 59 | Attending: Internal Medicine

## 2019-08-04 ENCOUNTER — Inpatient Hospital Stay: Payer: 59

## 2019-08-04 ENCOUNTER — Inpatient Hospital Stay: Payer: 59 | Admitting: Oncology

## 2019-08-26 ENCOUNTER — Inpatient Hospital Stay: Payer: 59

## 2019-08-26 ENCOUNTER — Inpatient Hospital Stay: Payer: 59 | Admitting: Oncology

## 2019-09-15 ENCOUNTER — Inpatient Hospital Stay: Payer: 59 | Admitting: Oncology

## 2019-09-15 ENCOUNTER — Inpatient Hospital Stay: Payer: 59

## 2019-11-11 MED FILL — LOSARTAN-HCTZ 50-12.5 MG TA: 50-12.5 | 90 days supply | Qty: 90 | Fill #1

## 2020-03-28 ENCOUNTER — Other Ambulatory Visit: Payer: Self-pay | Admitting: Family Medicine

## 2020-03-28 ENCOUNTER — Other Ambulatory Visit (HOSPITAL_COMMUNITY): Payer: Self-pay | Admitting: Family Medicine

## 2020-03-28 DIAGNOSIS — I10 Essential (primary) hypertension: Secondary | ICD-10-CM

## 2020-04-12 ENCOUNTER — Encounter: Payer: Self-pay | Admitting: Emergency Medicine

## 2020-04-12 ENCOUNTER — Emergency Department
Admission: EM | Admit: 2020-04-12 | Discharge: 2020-04-12 | Disposition: A | Discharge status: Home / Self Care | Payer: 59 | Attending: Emergency Medicine | Admitting: Emergency Medicine

## 2020-04-12 ENCOUNTER — Other Ambulatory Visit: Payer: Self-pay

## 2020-04-12 DIAGNOSIS — I10 Essential (primary) hypertension: Secondary | ICD-10-CM | POA: Diagnosis not present

## 2020-04-12 DIAGNOSIS — S65503A Unspecified injury of blood vessel of left middle finger, initial encounter: Secondary | ICD-10-CM | POA: Diagnosis present

## 2020-04-12 DIAGNOSIS — S61203A Unspecified open wound of left middle finger without damage to nail, initial encounter: Secondary | ICD-10-CM | POA: Insufficient documentation

## 2020-04-12 DIAGNOSIS — M546 Pain in thoracic spine: Secondary | ICD-10-CM | POA: Insufficient documentation

## 2020-04-12 DIAGNOSIS — Y990 Civilian activity done for income or pay: Secondary | ICD-10-CM | POA: Diagnosis not present

## 2020-04-12 DIAGNOSIS — S61253A Open bite of left middle finger without damage to nail, initial encounter: Secondary | ICD-10-CM | POA: Diagnosis not present

## 2020-04-12 DIAGNOSIS — W503XXA Accidental bite by another person, initial encounter: Secondary | ICD-10-CM

## 2020-04-12 DIAGNOSIS — Z79899 Other long term (current) drug therapy: Secondary | ICD-10-CM | POA: Insufficient documentation

## 2020-04-12 LAB — HIV ANTIBODY (ROUTINE TESTING W REFLEX): HIV Screen 4th Generation wRfx: NONREACTIVE

## 2020-04-12 MED ORDER — AMOXICILLIN-POT CLAVULANATE 875-125 MG PO TABS: 1.0000 | ORAL_TABLET | Freq: Two times a day (BID) | ORAL | 0 refills | Status: AC

## 2020-04-12 MED ORDER — AMOXICILLIN-POT CLAVULANATE 875-125 MG PO TABS
1.0000 | ORAL_TABLET | Freq: Once | ORAL | Status: AC
  Administered 2020-04-12: 1 via ORAL
  Filled 2020-04-12: qty 1
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
## 2020-04-12 NOTE — ED Notes (Signed)
Provided discharge instructions. Verbalized understanding.

## 2020-04-12 NOTE — ED Notes (Signed)
Received shift report from [facility].
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
## 2020-04-13 LAB — HEPATITIS B SURFACE ANTIBODY, QUANTITATIVE: Hep B S AB Quant (Post): 6.7 m[IU]/mL — ABNORMAL LOW (ref >9.9)

## 2020-05-19 ENCOUNTER — Other Ambulatory Visit: Payer: Self-pay

## 2020-05-19 MED FILL — Losartan Potassium & Hydrochlorothiazide Tab 50-12.5 MG: ORAL | 90 days supply | Qty: 90 | Fill #0 | Status: AC

## 2020-09-27 ENCOUNTER — Other Ambulatory Visit: Payer: Self-pay

## 2020-09-27 MED FILL — Losartan Potassium & Hydrochlorothiazide Tab 50-12.5 MG: ORAL | 90 days supply | Qty: 90 | Fill #1 | Status: AC

## 2020-11-08 ENCOUNTER — Telehealth: Payer: Self-pay | Admitting: Family Medicine

## 2020-11-18 NOTE — Telephone Encounter (Signed)
Appointment rescheduled. Closing encounter.

## 2020-11-21 ENCOUNTER — Other Ambulatory Visit: Payer: Self-pay

## 2020-11-21 ENCOUNTER — Other Ambulatory Visit: Payer: 59

## 2020-11-21 DIAGNOSIS — E785 Hyperlipidemia, unspecified: Secondary | ICD-10-CM

## 2020-11-21 DIAGNOSIS — Z125 Encounter for screening for malignant neoplasm of prostate: Secondary | ICD-10-CM

## 2020-11-21 DIAGNOSIS — I10 Essential (primary) hypertension: Secondary | ICD-10-CM

## 2020-11-21 DIAGNOSIS — Z1159 Encounter for screening for other viral diseases: Secondary | ICD-10-CM

## 2020-11-21 DIAGNOSIS — Z13220 Encounter for screening for lipoid disorders: Secondary | ICD-10-CM | POA: Diagnosis not present

## 2020-11-21 DIAGNOSIS — Z136 Encounter for screening for cardiovascular disorders: Secondary | ICD-10-CM | POA: Diagnosis not present

## 2020-11-22 LAB — HEPATITIS C ANTIBODY
Hepatitis C Ab: NONREACTIVE
SIGNAL TO CUT-OFF: 0.12 (ref <1.00)

## 2020-11-22 LAB — COMPLETE METABOLIC PANEL WITH GFR
AG Ratio: 1.5 (calc) (ref 1.0–2.5)
ALT: 29 U/L (ref 9–46)
AST: 38 U/L (ref 10–40)
Albumin: 4.3 g/dL (ref 3.6–5.1)
Alkaline phosphatase (APISO): 67 U/L (ref 36–130)
BUN: 12 mg/dL (ref 7–25)
CO2: 28 mmol/L (ref 20–32)
Calcium: 9.7 mg/dL (ref 8.6–10.3)
Chloride: 101 mmol/L (ref 98–110)
Creat: 1.19 mg/dL (ref 0.60–1.29)
Globulin: 2.9 g/dL (calc) (ref 1.9–3.7)
Glucose, Bld: 102 mg/dL — ABNORMAL HIGH (ref 65–99)
Potassium: 5 mmol/L (ref 3.5–5.3)
Sodium: 138 mmol/L (ref 135–146)
Total Bilirubin: 1 mg/dL (ref 0.2–1.2)
Total Protein: 7.2 g/dL (ref 6.1–8.1)
eGFR: 76 mL/min/{1.73_m2} (ref >=60)

## 2020-11-22 LAB — CBC WITH DIFFERENTIAL/PLATELET
Absolute Monocytes: 637 cells/uL (ref 200–950)
Basophils Absolute: 33 cells/uL (ref 0–200)
Basophils Relative: 0.5 %
Eosinophils Absolute: 169 cells/uL (ref 15–500)
Eosinophils Relative: 2.6 %
HCT: 51.7 % — ABNORMAL HIGH (ref 38.5–50.0)
Hemoglobin: 17.1 g/dL (ref 13.2–17.1)
Lymphs Abs: 1619 cells/uL (ref 850–3900)
MCH: 27.6 pg (ref 27.0–33.0)
MCHC: 33.1 g/dL (ref 32.0–36.0)
MCV: 83.4 fL (ref 80.0–100.0)
MPV: 10.5 fL (ref 7.5–12.5)
Monocytes Relative: 9.8 %
Neutro Abs: 4043 cells/uL (ref 1500–7800)
Neutrophils Relative %: 62.2 %
Platelets: 203 10*3/uL (ref 140–400)
RBC: 6.2 10*6/uL — ABNORMAL HIGH (ref 4.20–5.80)
RDW: 13.7 % (ref 11.0–15.0)
Total Lymphocyte: 24.9 %
WBC: 6.5 10*3/uL (ref 3.8–10.8)

## 2020-11-22 LAB — LIPID PANEL
Cholesterol: 270 mg/dL — ABNORMAL HIGH (ref <200)
HDL: 53 mg/dL (ref >=40)
LDL Cholesterol (Calc): 191 mg/dL (calc) — ABNORMAL HIGH
Non-HDL Cholesterol (Calc): 217 mg/dL (calc) — ABNORMAL HIGH (ref <130)
Total CHOL/HDL Ratio: 5.1 (calc) — ABNORMAL HIGH (ref <5.0)
Triglycerides: 127 mg/dL (ref <150)

## 2020-11-22 LAB — PSA: PSA: 0.49 ng/mL (ref <=4.00)

## 2020-11-23 ENCOUNTER — Other Ambulatory Visit: Payer: 59

## 2020-11-24 ENCOUNTER — Encounter: Payer: Self-pay | Admitting: Family Medicine

## 2020-11-24 ENCOUNTER — Ambulatory Visit (INDEPENDENT_AMBULATORY_CARE_PROVIDER_SITE_OTHER): Payer: 59 | Admitting: Family Medicine

## 2020-11-24 ENCOUNTER — Other Ambulatory Visit: Payer: Self-pay

## 2020-11-24 VITALS — BP 138/102 | HR 85 | Temp 97.3°F | Resp 18 | Ht 68.0 in | Wt 195.0 lb

## 2020-11-24 DIAGNOSIS — I10 Essential (primary) hypertension: Secondary | ICD-10-CM

## 2020-11-24 DIAGNOSIS — Z1211 Encounter for screening for malignant neoplasm of colon: Secondary | ICD-10-CM

## 2020-11-24 DIAGNOSIS — Z0000 Encounter for general adult medical examination without abnormal findings: Secondary | ICD-10-CM | POA: Diagnosis not present

## 2020-11-24 DIAGNOSIS — E785 Hyperlipidemia, unspecified: Secondary | ICD-10-CM | POA: Diagnosis not present

## 2020-11-24 MED ORDER — ATORVASTATIN CALCIUM 40 MG PO TABS
40.0000 mg | ORAL_TABLET | Freq: Every day | ORAL | 3 refills | Status: DC
  Filled 2020-11-24: qty 90, 90d supply, fill #0
  Filled 2021-03-24: qty 90, 90d supply, fill #1
  Filled 2021-06-20: qty 90, 90d supply, fill #2

## 2020-11-24 NOTE — Progress Notes (Signed)
Subjective:    Patient ID: Leonard Schmitt, male    DOB: 01-08-1976, 45 y.o.   MRN: [id number] Has not been seen since 2020.  Patient is drinking 2, 24 ounce beers every day.  This may explain part of the elevation in his blood sugar.  He is due for a colonoscopy.  His blood pressure is elevated today however he just came from exercising.  His lab work however significant for severe hyperlipidemia.  He has not been taking atorvastatin.  His flu shot is up-to-date.  Otherwise he is doing well Lab on 11/21/2020  Component Date Value Ref Range Status   WBC 11/21/2020 6.5  3.8 - 10.8 Thousand/uL Final   RBC 11/21/2020 6.20 (A)  4.20 - 5.80 Million/uL Final   Hemoglobin 11/21/2020 17.1  13.2 - 17.1 g/dL Final   HCT 11/21/2020 51.7 (A)  38.5 - 50.0 % Final   MCV 11/21/2020 83.4  80.0 - 100.0 fL Final   MCH 11/21/2020 27.6  27.0 - 33.0 pg Final   MCHC 11/21/2020 33.1  32.0 - 36.0 g/dL Final   RDW 11/21/2020 13.7  11.0 - 15.0 % Final   Platelets 11/21/2020 203  140 - 400 Thousand/uL Final   MPV 11/21/2020 10.5  7.5 - 12.5 fL Final   Neutro Abs 11/21/2020 4,043  1,500 - 7,800 cells/uL Final   Lymphs Abs 11/21/2020 1,619  850 - 3,900 cells/uL Final   Absolute Monocytes 11/21/2020 637  200 - 950 cells/uL Final   Eosinophils Absolute 11/21/2020 169  15 - 500 cells/uL Final   Basophils Absolute 11/21/2020 33  0 - 200 cells/uL Final   Neutrophils Relative % 11/21/2020 62.2  % Final   Total Lymphocyte 11/21/2020 24.9  % Final   Monocytes Relative 11/21/2020 9.8  % Final   Eosinophils Relative 11/21/2020 2.6  % Final   Basophils Relative 11/21/2020 0.5  % Final   Glucose, Bld 11/21/2020 102 (A)  65 - 99 mg/dL Final   Comment: .            Fasting reference interval . For someone without known diabetes, a glucose value between 100 and 125 mg/dL is consistent with prediabetes and should be confirmed with a follow-up test. .    BUN 11/21/2020 12  7 - 25 mg/dL Final   Creat 11/21/2020 1.19  0.60 -  1.29 mg/dL Final   eGFR 11/21/2020 76  > OR = 60 mL/min/1.27m Final   Comment: The eGFR is based on the CKD-EPI 2021 equation. To calculate  the new eGFR from a previous Creatinine or Cystatin C result, go to https://www.kidney.org/professionals/ kdoqi/gfr%5Fcalculator    BUN/Creatinine Ratio 178/46/9629NOT APPLICABLE  6 - 22 (calc) Final   Sodium 11/21/2020 138  135 - 146 mmol/L Final   Potassium 11/21/2020 5.0  3.5 - 5.3 mmol/L Final   Chloride 11/21/2020 101  98 - 110 mmol/L Final   CO2 11/21/2020 28  20 - 32 mmol/L Final   Calcium 11/21/2020 9.7  8.6 - 10.3 mg/dL Final   Total Protein 11/21/2020 7.2  6.1 - 8.1 g/dL Final   Albumin 11/21/2020 4.3  3.6 - 5.1 g/dL Final   Globulin 11/21/2020 2.9  1.9 - 3.7 g/dL (calc) Final   AG Ratio 11/21/2020 1.5  1.0 - 2.5 (calc) Final   Total Bilirubin 11/21/2020 1.0  0.2 - 1.2 mg/dL Final   Alkaline phosphatase (APISO) 11/21/2020 67  36 - 130 U/L Final   AST 11/21/2020 38  10 - 40 U/L  Final   ALT 11/21/2020 29  9 - 46 U/L Final   PSA 11/21/2020 0.49  < OR = 4.00 ng/mL Final   Comment: The total PSA value from this assay system is  standardized against the WHO standard. The test  result will be approximately 20% lower when compared  to the equimolar-standardized total PSA (Beckman  Coulter). Comparison of serial PSA results should be  interpreted with this fact in mind. . This test was performed using the Siemens  chemiluminescent method. Values obtained from  different assay methods cannot be used interchangeably. PSA levels, regardless of value, should not be interpreted as absolute evidence of the presence or absence of disease.    Hepatitis C Ab 11/21/2020 NON-REACTIVE  NON-REACTIVE Final   SIGNAL TO CUT-OFF 11/21/2020 0.12  <1.00 Final   Comment: . HCV antibody was non-reactive. There is no laboratory  evidence of HCV infection. . In most cases, no further action is required. However, if recent HCV exposure is suspected, a test  for HCV RNA (test code [phone]) is suggested. . For additional information please refer to http://education.[facility].com/faq/FAQ22v1 (This link is being provided for informational/ educational purposes only.) .    Cholesterol 11/21/2020 270 (A)  <200 mg/dL Final   HDL 11/21/2020 53  > OR = 40 mg/dL Final   Triglycerides 11/21/2020 127  <150 mg/dL Final   LDL Cholesterol (Calc) 11/21/2020 191 (A)  mg/dL (calc) Final   Comment: LDL-C levels > or = 190 mg/dL may indicate familial  hypercholesterolemia (FH). Clinical assessment and  measurement of blood lipid levels should be  considered for all first degree relatives of  patients with an FH diagnosis.  For questions about testing for familial hypercholesterolemia, please call Insurance risk surveyor at [email or web address]. Duncan Dull, et al. J National Lipid Association  Recommendations for Patient-Centered Management of  Dyslipidemia: Part 1 Journal of Clinical Lipidology  2015;9(2), 129-169. Reference range: <100 . Desirable range <100 mg/dL for primary prevention;   <70 mg/dL for patients with CHD or diabetic patients  with > or = 2 CHD risk factors. Marland Kitchen LDL-C is now calculated using the Martin-Hopkins  calculation, which is a validated novel method providing  better accuracy than the Friedewald equation in the  estimation of LDL-C.  Cresenciano Genre et al. Annamaria Helling. 6503;546(56): 2061-2068  (http://education.[facility].com/faq/FAQ164)    Total CHOL/HDL Ratio 11/21/2020 5.1 (A)  <5.0 (calc) Final   Non-HDL Cholesterol (Calc) 11/21/2020 217 (A)  <130 mg/dL (calc) Final   Comment: For patients with diabetes plus 1 major ASCVD risk  factor, treating to a non-HDL-C goal of <100 mg/dL  (LDL-C of <70 mg/dL) is considered a therapeutic  option.     Past Medical History:  Diagnosis Date   Hyperlipemia    Hypertension    borderline   History reviewed. No pertinent surgical history. Current Outpatient Medications on File  Prior to Visit  Medication Sig Dispense Refill   losartan-hydrochlorothiazide (HYZAAR) 50-12.5 MG tablet TAKE 1 TABLET BY MOUTH DAILY 90 tablet 2   atorvastatin (LIPITOR) 40 MG tablet Take 1 tablet (40 mg total) by mouth daily. (Patient not taking: Reported on 11/24/2020) 30 tablet 2   losartan-hydrochlorothiazide (HYZAAR) 50-12.5 MG tablet TAKE 1 TABLET BY MOUTH DAILY 90 tablet 2   moxifloxacin (VIGAMOX) 0.5 % ophthalmic solution Place 1 drop into the right eye 3 (three) times daily. 3 mL 0   No current facility-administered medications on file prior to visit.   No Known Allergies Social History   Socioeconomic  History   Marital status: Married    Spouse name: Not on file   Number of children: Not on file   Years of education: Masters   Highest education level: Not on file  Occupational History   Occupation: [employer]  Tobacco Use   Smoking status: Never   Smokeless tobacco: Never  Vaping Use   Vaping Use: Never used  Substance and Sexual Activity   Alcohol use: Yes    Alcohol/week: 6.0 - 7.0 standard drinks    Types: 4 Cans of beer, 2 - 3 Shots of liquor per week    Comment: 1-2 beer every other day   Drug use: No   Sexual activity: Yes  Other Topics Concern   Not on file  Social History Narrative   Not on file   Social Determinants of Health   Financial Resource Strain: Not on file  Food Insecurity: Not on file  Transportation Needs: Not on file  Physical Activity: Not on file  Stress: Not on file  Social Connections: Not on file  Intimate Partner Violence: Not on file      Review of Systems  All other systems reviewed and are negative.     Objective:   Physical Exam Vitals reviewed.  Neck:     Vascular: No JVD.  Cardiovascular:     Rate and Rhythm: Normal rate and regular rhythm.     Heart sounds: Normal heart sounds. No murmur heard. Pulmonary:     Effort: Pulmonary effort is normal. No respiratory distress.     Breath sounds:  Normal breath sounds. No wheezing or rales.  Abdominal:     General: Bowel sounds are normal. There is no distension.     Palpations: Abdomen is soft.     Tenderness: There is no abdominal tenderness. There is no rebound.        Assessment & Plan:  Colon cancer screening - Plan: Ambulatory referral to Gastroenterology  Benign essential HTN  Hyperlipidemia, unspecified hyperlipidemia type  General medical exam Blood pressure today is poorly controlled.  Patient elects to check his blood pressure every day for 1 week and report the values to me.  If greater than 140/90 we will increase his losartan hydrochlorothiazide to 100/25.  Cholesterol is terrible.  Start Lipitor 40 mg a day and recheck fasting lipid panel in 3 months.  Immunizations are up-to-date.  Schedule the patient for a colonoscopy.  Strongly encourage the patient to reduce his drinking.

## 2020-12-02 ENCOUNTER — Encounter: Payer: 59 | Admitting: Family Medicine

## 2021-01-24 ENCOUNTER — Other Ambulatory Visit: Payer: Self-pay

## 2021-01-24 MED FILL — Losartan Potassium & Hydrochlorothiazide Tab 50-12.5 MG: ORAL | 60 days supply | Qty: 60 | Fill #2 | Status: AC

## 2021-02-23 ENCOUNTER — Ambulatory Visit: Payer: 59 | Admitting: Family Medicine

## 2021-02-27 ENCOUNTER — Ambulatory Visit: Payer: 59 | Admitting: Family Medicine

## 2021-02-27 ENCOUNTER — Other Ambulatory Visit: Payer: Self-pay

## 2021-02-27 ENCOUNTER — Encounter: Payer: Self-pay | Admitting: Family Medicine

## 2021-02-27 VITALS — BP 162/90 | HR 72 | Temp 98.0°F | Resp 20 | Ht 68.0 in | Wt 194.0 lb

## 2021-02-27 DIAGNOSIS — R748 Abnormal levels of other serum enzymes: Secondary | ICD-10-CM

## 2021-02-27 DIAGNOSIS — L738 Other specified follicular disorders: Secondary | ICD-10-CM

## 2021-02-27 DIAGNOSIS — I10 Essential (primary) hypertension: Secondary | ICD-10-CM | POA: Diagnosis not present

## 2021-02-27 DIAGNOSIS — E785 Hyperlipidemia, unspecified: Secondary | ICD-10-CM

## 2021-02-27 LAB — COMPLETE METABOLIC PANEL WITH GFR
AG Ratio: 1.4 (calc) (ref 1.0–2.5)
ALT: 64 U/L — ABNORMAL HIGH (ref 9–46)
AST: 41 U/L — ABNORMAL HIGH (ref 10–40)
Albumin: 4.2 g/dL (ref 3.6–5.1)
Alkaline phosphatase (APISO): 74 U/L (ref 36–130)
BUN: 11 mg/dL (ref 7–25)
CO2: 26 mmol/L (ref 20–32)
Calcium: 9.4 mg/dL (ref 8.6–10.3)
Chloride: 99 mmol/L (ref 98–110)
Creat: 1.01 mg/dL (ref 0.60–1.29)
Globulin: 3.1 g/dL (calc) (ref 1.9–3.7)
Glucose, Bld: 76 mg/dL (ref 65–99)
Potassium: 3.9 mmol/L (ref 3.5–5.3)
Sodium: 136 mmol/L (ref 135–146)
Total Bilirubin: 1.1 mg/dL (ref 0.2–1.2)
Total Protein: 7.3 g/dL (ref 6.1–8.1)
eGFR: 92 mL/min/{1.73_m2} (ref >=60)

## 2021-02-27 LAB — LIPID PANEL
Cholesterol: 172 mg/dL (ref <200)
HDL: 49 mg/dL (ref >=40)
LDL Cholesterol (Calc): 99 mg/dL (calc)
Non-HDL Cholesterol (Calc): 123 mg/dL (calc) (ref <130)
Total CHOL/HDL Ratio: 3.5 (calc) (ref <5.0)
Triglycerides: 141 mg/dL (ref <150)

## 2021-02-27 MED ORDER — LOSARTAN POTASSIUM-HCTZ 100-25 MG PO TABS
1.0000 | ORAL_TABLET | Freq: Every day | ORAL | 3 refills | Status: DC
  Filled 2021-02-27 – 2021-03-24 (×2): qty 90, 90d supply, fill #0
  Filled 2021-06-20: qty 90, 90d supply, fill #1
  Filled 2021-10-27: qty 90, 90d supply, fill #2

## 2021-02-27 NOTE — Progress Notes (Signed)
Subjective:    Patient ID: Leonard Schmitt, male    DOB: 28-May-1975, 46 y.o.   MRN: [id number] 11/22 Has not been seen since 2020.  Patient is drinking 2, 24 ounce beers every day.  This may explain part of the elevation in his blood sugar.  He is due for a colonoscopy.  His blood pressure is elevated today however he just came from exercising.  His lab work however significant for severe hyperlipidemia.  He has not been taking atorvastatin.  His flu shot is up-to-date.  Otherwise he is doing well.  At that time, my plan was:  Blood pressure today is poorly controlled.  Patient elects to check his blood pressure every day for 1 week and report the values to me.  If greater than 140/90 we will increase his losartan hydrochlorothiazide to 100/25.  Cholesterol is terrible.  Start Lipitor 40 mg a day and recheck fasting lipid panel in 3 months.  Immunizations are up-to-date.  Schedule the patient for a colonoscopy.  Strongly encourage the patient to reduce his drinking.  02/27/21 Patient is here today for follow-up of his hypertension.  I am very proud of him.  He has been checking his blood pressures.  At home his blood pressures have been running in the 140s over 90s.  His blood pressure here is high however he is very anxious and does have whitecoat syndrome.  He has been consistently checking his blood pressures at home.  He denies any chest pain, shortness of breath, dyspnea on exertion, headaches.  He denies any side effects from the Hyzaar.  He is also been compliant taking the Lipitor.  He denies any myalgias or right upper quadrant pain.  He is due for recheck his cholesterol.  He also has folliculitis barbae.  He has cystic lesions in his beard on both sides that frequently drain foul-smelling material.  He would like a referral back to a dermatologist.  In the past they tried him on doxycycline.  He has not yet tried Accutane.   Past Medical History:  Diagnosis Date   Hyperlipemia    Hypertension     borderline   No past surgical history on file. Current Outpatient Medications on File Prior to Visit  Medication Sig Dispense Refill   atorvastatin (LIPITOR) 40 MG tablet Take 1 tablet (40 mg total) by mouth daily. 90 tablet 3   losartan-hydrochlorothiazide (HYZAAR) 50-12.5 MG tablet TAKE 1 TABLET BY MOUTH DAILY 90 tablet 2   losartan-hydrochlorothiazide (HYZAAR) 50-12.5 MG tablet TAKE 1 TABLET BY MOUTH DAILY 90 tablet 2   moxifloxacin (VIGAMOX) 0.5 % ophthalmic solution Place 1 drop into the right eye 3 (three) times daily. 3 mL 0   No current facility-administered medications on file prior to visit.   No Known Allergies Social History   Socioeconomic History   Marital status: Married    Spouse name: Not on file   Number of children: Not on file   Years of education: Masters   Highest education level: Not on file  Occupational History   Occupation: [employer]  Tobacco Use   Smoking status: Never   Smokeless tobacco: Never  Vaping Use   Vaping Use: Never used  Substance and Sexual Activity   Alcohol use: Yes    Alcohol/week: 6.0 - 7.0 standard drinks    Types: 4 Cans of beer, 2 - 3 Shots of liquor per week    Comment: 1-2 beer every other day   Drug use: No  Sexual activity: Yes  Other Topics Concern   Not on file  Social History Narrative   Not on file   Social Determinants of Health   Financial Resource Strain: Not on file  Food Insecurity: Not on file  Transportation Needs: Not on file  Physical Activity: Not on file  Stress: Not on file  Social Connections: Not on file  Intimate Partner Violence: Not on file      Review of Systems  All other systems reviewed and are negative.     Objective:   Physical Exam Vitals reviewed.  Neck:     Vascular: No JVD.  Cardiovascular:     Rate and Rhythm: Normal rate and regular rhythm.     Heart sounds: Normal heart sounds. No murmur heard. Pulmonary:     Effort: Pulmonary effort is normal.  No respiratory distress.     Breath sounds: Normal breath sounds. No wheezing or rales.  Abdominal:     General: Bowel sounds are normal. There is no distension.     Palpations: Abdomen is soft.     Tenderness: There is no abdominal tenderness. There is no rebound.        Assessment & Plan:  Benign essential HTN - Plan: Lipid panel, COMPLETE METABOLIC PANEL WITH GFR  Hyperlipidemia, unspecified hyperlipidemia type - Plan: Lipid panel, COMPLETE METABOLIC PANEL WITH GFR  Folliculitis barbae - Plan: Ambulatory referral to Dermatology Blood pressure here is extremely high however his blood pressures at home are close to goal.  We will increase Hyzaar to 100/25 and hopefully this will get his the rest of the way to goal.  The patient will check his blood pressure daily and email me the values in 2 weeks.  I will go ahead and send in a prescription for the higher dose.  Check a fasting lipid panel today.  His LDL cholesterol was well above 190.  If we can get his LDL cholesterol below 130 I will be very happy.  Ultimately his goal will be less than 100.  I will consult dermatology.

## 2021-02-28 NOTE — Addendum Note (Signed)
Addended by: Wadie Lessen on: 02/28/2021 05:37 PM   Modules accepted: Orders

## 2021-03-10 ENCOUNTER — Other Ambulatory Visit: Payer: Self-pay

## 2021-03-24 ENCOUNTER — Other Ambulatory Visit: Payer: Self-pay

## 2021-03-30 ENCOUNTER — Encounter: Payer: Self-pay | Admitting: Family Medicine

## 2021-05-19 ENCOUNTER — Telehealth: Payer: Self-pay

## 2021-05-19 NOTE — Telephone Encounter (Signed)
Pt called today stated that he is experiencing erectile dysfunction due to the increase of his losartan-hydrochlorothiazide. Should he continue taking his med? ? ?Please advice? ?

## 2021-05-19 NOTE — Telephone Encounter (Signed)
Called pt and advice pt per Dr. Dennard Schaumann, he can try something else, however before any changes can made, Dr. Dennard Schaumann would need to know how his b/p has been running. Advice pt to keep track of his b/p for the weekends and call back on Monday.  ? ?Dr. Dennard Schaumann will decide then. Pt voiced understanding.  ?

## 2021-05-29 ENCOUNTER — Ambulatory Visit: Payer: 59 | Admitting: Family Medicine

## 2021-05-30 ENCOUNTER — Ambulatory Visit: Payer: 59 | Admitting: Family Medicine

## 2021-05-30 ENCOUNTER — Other Ambulatory Visit: Payer: Self-pay

## 2021-05-30 VITALS — BP 132/102 | HR 64 | Temp 97.4°F | Ht 68.0 in | Wt 193.8 lb

## 2021-05-30 DIAGNOSIS — I10 Essential (primary) hypertension: Secondary | ICD-10-CM | POA: Diagnosis not present

## 2021-05-30 DIAGNOSIS — R748 Abnormal levels of other serum enzymes: Secondary | ICD-10-CM | POA: Diagnosis not present

## 2021-05-30 DIAGNOSIS — E785 Hyperlipidemia, unspecified: Secondary | ICD-10-CM

## 2021-05-30 MED ORDER — SILDENAFIL CITRATE 100 MG PO TABS
50.0000 mg | ORAL_TABLET | Freq: Every day | ORAL | 11 refills | Status: DC | PRN
Start: 2021-05-30 — End: 2022-06-20
  Filled 2021-05-30: qty 6, 30d supply, fill #0
  Filled 2021-06-26: qty 6, 30d supply, fill #1
  Filled 2021-08-02: qty 6, 30d supply, fill #2
  Filled 2021-08-24: qty 6, 30d supply, fill #3
  Filled 2021-09-28: qty 6, 30d supply, fill #4
  Filled 2021-10-27: qty 6, 30d supply, fill #5
  Filled 2021-11-29: qty 6, 30d supply, fill #6
  Filled 2022-01-03 (×2): qty 6, 30d supply, fill #7
  Filled 2022-01-31: qty 6, 30d supply, fill #8
  Filled 2022-02-28: qty 6, 30d supply, fill #9
  Filled 2022-03-29: qty 6, 30d supply, fill #10
  Filled 2022-04-26: qty 6, 30d supply, fill #11
  Filled 2022-05-22: qty 6, 30d supply, fill #12

## 2021-05-30 NOTE — Progress Notes (Signed)
? ?Subjective:  ? ? Patient ID: Leonard Schmitt, male    DOB: 10-Apr-1975, 45 y.o.   MRN: [id number] ?Patient is a very pleasant 46 year old African-American gentleman here today to recheck his blood pressure.  His blood pressure here is elevated however he has been checking it at home and getting 120-130/80-90.  This is acceptable to me.  However since starting the medication he has been having erectile difficulties.  We discussed his options including switching one of his medication to amlodipine to see if he would tolerate it better versus Viagra.  Patient feels more comfortable trying Viagra.  He also has some mild elevations in his liver function test we checked his cholesterol in February.  He is here today to recheck that. ?Past Medical History:  ?Diagnosis Date  ? Hyperlipemia   ? Hypertension   ? borderline  ? ?No past surgical history on file. ?Current Outpatient Medications on File Prior to Visit  ?Medication Sig Dispense Refill  ? atorvastatin (LIPITOR) 40 MG tablet Take 1 tablet (40 mg total) by mouth daily. 90 tablet 3  ? losartan-hydrochlorothiazide (HYZAAR) 100-25 MG tablet Take 1 tablet by mouth daily. 90 tablet 3  ? ?No current facility-administered medications on file prior to visit.  ? ?No Known Allergies ?Social History  ? ?Socioeconomic History  ? Marital status: Married  ?  Spouse name: Not on file  ? Number of children: Not on file  ? Years of education: Masters  ? Highest education level: Not on file  ?Occupational History  ? Occupation: [employer]  ?Tobacco Use  ? Smoking status: Never  ? Smokeless tobacco: Never  ?Vaping Use  ? Vaping Use: Never used  ?Substance and Sexual Activity  ? Alcohol use: Yes  ?  Alcohol/week: 6.0 - 7.0 standard drinks  ?  Types: 4 Cans of beer, 2 - 3 Shots of liquor per week  ?  Comment: 1-2 beer every other day  ? Drug use: No  ? Sexual activity: Yes  ?Other Topics Concern  ? Not on file  ?Social History Narrative  ? Not on file  ? ?Social  Determinants of Health  ? ?Financial Resource Strain: Not on file  ?Food Insecurity: Not on file  ?Transportation Needs: Not on file  ?Physical Activity: Not on file  ?Stress: Not on file  ?Social Connections: Not on file  ?Intimate Partner Violence: Not on file  ? ? ? ? ?Review of Systems  ?All other systems reviewed and are negative. ? ?   ?Objective:  ? Physical Exam ?Vitals reviewed.  ?Neck:  ?   Vascular: No JVD.  ?Cardiovascular:  ?   Rate and Rhythm: Normal rate and regular rhythm.  ?   Heart sounds: Normal heart sounds. No murmur heard. ?Pulmonary:  ?   Effort: Pulmonary effort is normal. No respiratory distress.  ?   Breath sounds: Normal breath sounds. No wheezing or rales.  ?Abdominal:  ?   General: Bowel sounds are normal. There is no distension.  ?   Palpations: Abdomen is soft.  ?   Tenderness: There is no abdominal tenderness. There is no rebound.  ? ? ? ?   ?Assessment & Plan:  ?Hyperlipidemia, unspecified hyperlipidemia type - Plan: CBC with Differential/Platelet, Lipid panel, COMPLETE METABOLIC PANEL WITH GFR ? ?Benign essential HTN ? ?Elevated liver enzymes ?Home blood pressures are outstanding.  Check CMP and fasting lipid panel.  Goal LDL cholesterol is less than 100.  Monitor liver function test.  If  rising higher, I would take the patient off his statin and work-up his liver enzymes.  I will give the patient Viagra 50 to 100 mg every day as needed for erectile difficulties. ?

## 2021-05-31 LAB — COMPLETE METABOLIC PANEL WITH GFR
AG Ratio: 1.5 (calc) (ref 1.0–2.5)
ALT: 31 U/L (ref 9–46)
AST: 23 U/L (ref 10–40)
Albumin: 4.4 g/dL (ref 3.6–5.1)
Alkaline phosphatase (APISO): 66 U/L (ref 36–130)
BUN: 15 mg/dL (ref 7–25)
CO2: 29 mmol/L (ref 20–32)
Calcium: 9.9 mg/dL (ref 8.6–10.3)
Chloride: 100 mmol/L (ref 98–110)
Creat: 1.29 mg/dL (ref 0.60–1.29)
Globulin: 2.9 g/dL (calc) (ref 1.9–3.7)
Glucose, Bld: 91 mg/dL (ref 65–99)
Potassium: 4.5 mmol/L (ref 3.5–5.3)
Sodium: 139 mmol/L (ref 135–146)
Total Bilirubin: 0.9 mg/dL (ref 0.2–1.2)
Total Protein: 7.3 g/dL (ref 6.1–8.1)
eGFR: 68 mL/min/{1.73_m2} (ref >=60)

## 2021-05-31 LAB — LIPID PANEL
Cholesterol: 152 mg/dL (ref <200)
HDL: 42 mg/dL (ref >=40)
LDL Cholesterol (Calc): 86 mg/dL (calc)
Non-HDL Cholesterol (Calc): 110 mg/dL (calc) (ref <130)
Total CHOL/HDL Ratio: 3.6 (calc) (ref <5.0)
Triglycerides: 139 mg/dL (ref <150)

## 2021-05-31 LAB — CBC WITH DIFFERENTIAL/PLATELET
Absolute Monocytes: 782 cells/uL (ref 200–950)
Basophils Absolute: 63 cells/uL (ref 0–200)
Basophils Relative: 0.8 %
Eosinophils Absolute: 308 cells/uL (ref 15–500)
Eosinophils Relative: 3.9 %
HCT: 52.7 % — ABNORMAL HIGH (ref 38.5–50.0)
Hemoglobin: 17.6 g/dL — ABNORMAL HIGH (ref 13.2–17.1)
Lymphs Abs: 1762 cells/uL (ref 850–3900)
MCH: 27.8 pg (ref 27.0–33.0)
MCHC: 33.4 g/dL (ref 32.0–36.0)
MCV: 83.4 fL (ref 80.0–100.0)
MPV: 10.5 fL (ref 7.5–12.5)
Monocytes Relative: 9.9 %
Neutro Abs: 4985 cells/uL (ref 1500–7800)
Neutrophils Relative %: 63.1 %
Platelets: 254 10*3/uL (ref 140–400)
RBC: 6.32 10*6/uL — ABNORMAL HIGH (ref 4.20–5.80)
RDW: 13.3 % (ref 11.0–15.0)
Total Lymphocyte: 22.3 %
WBC: 7.9 10*3/uL (ref 3.8–10.8)

## 2021-06-20 ENCOUNTER — Other Ambulatory Visit: Payer: Self-pay

## 2021-06-26 ENCOUNTER — Other Ambulatory Visit: Payer: Self-pay

## 2021-06-28 ENCOUNTER — Other Ambulatory Visit: Payer: Self-pay

## 2021-07-17 ENCOUNTER — Other Ambulatory Visit (HOSPITAL_COMMUNITY): Payer: Self-pay

## 2021-08-02 ENCOUNTER — Other Ambulatory Visit: Payer: Self-pay

## 2021-08-24 ENCOUNTER — Other Ambulatory Visit: Payer: Self-pay

## 2021-08-28 ENCOUNTER — Other Ambulatory Visit: Payer: Self-pay

## 2021-09-05 ENCOUNTER — Other Ambulatory Visit: Payer: Self-pay

## 2021-09-28 ENCOUNTER — Other Ambulatory Visit: Payer: Self-pay

## 2021-10-27 ENCOUNTER — Other Ambulatory Visit: Payer: Self-pay

## 2021-11-29 ENCOUNTER — Other Ambulatory Visit: Payer: Self-pay

## 2022-01-03 ENCOUNTER — Other Ambulatory Visit: Payer: Self-pay

## 2022-01-03 ENCOUNTER — Other Ambulatory Visit: Payer: Self-pay | Admitting: Family Medicine

## 2022-01-03 MED ORDER — ATORVASTATIN CALCIUM 40 MG PO TABS
40.0000 mg | ORAL_TABLET | Freq: Every day | ORAL | 3 refills | Status: DC
  Filled 2022-01-03: qty 90, 90d supply, fill #0
  Filled 2022-03-29: qty 90, 90d supply, fill #1
  Filled 2022-07-25: qty 90, 90d supply, fill #2
  Filled 2022-11-30: qty 60, 60d supply, fill #3
  Filled 2022-11-30: qty 90, 90d supply, fill #3
  Filled 2022-11-30: qty 30, 30d supply, fill #3

## 2022-01-31 ENCOUNTER — Other Ambulatory Visit: Payer: Self-pay

## 2022-02-28 ENCOUNTER — Other Ambulatory Visit: Payer: Self-pay

## 2022-03-29 ENCOUNTER — Other Ambulatory Visit: Payer: Self-pay | Admitting: Family Medicine

## 2022-03-29 ENCOUNTER — Other Ambulatory Visit: Payer: Self-pay

## 2022-03-29 MED ORDER — LOSARTAN POTASSIUM-HCTZ 100-25 MG PO TABS
1.0000 | ORAL_TABLET | Freq: Every day | ORAL | 3 refills | Status: DC
Start: 2022-03-29 — End: 2023-08-13
  Filled 2022-03-29: qty 90, 90d supply, fill #0
  Filled 2022-07-25: qty 90, 90d supply, fill #1
  Filled 2022-11-30: qty 90, 90d supply, fill #2
  Filled 2023-02-21: qty 90, 90d supply, fill #3

## 2022-04-26 ENCOUNTER — Other Ambulatory Visit: Payer: Self-pay

## 2022-05-22 ENCOUNTER — Other Ambulatory Visit: Payer: Self-pay

## 2022-06-20 ENCOUNTER — Other Ambulatory Visit: Payer: Self-pay | Admitting: Family Medicine

## 2022-06-20 ENCOUNTER — Other Ambulatory Visit: Payer: Self-pay

## 2022-06-20 MED ORDER — SILDENAFIL CITRATE 100 MG PO TABS
50.0000 mg | ORAL_TABLET | Freq: Every day | ORAL | 5 refills | Status: DC | PRN
  Filled 2022-06-20: qty 6, 30d supply, fill #0
  Filled 2022-07-25: qty 6, 30d supply, fill #1
  Filled 2022-08-16: qty 6, 30d supply, fill #2
  Filled 2022-09-21: qty 30, 30d supply, fill #3
  Filled 2022-11-30: qty 9, 9d supply, fill #4
  Filled 2022-11-30: qty 6, 30d supply, fill #4
  Filled 2022-11-30: qty 12, 30d supply, fill #4
  Filled 2022-11-30: qty 12, 12d supply, fill #4
  Filled 2022-11-30: qty 3, 3d supply, fill #4

## 2022-07-25 ENCOUNTER — Other Ambulatory Visit: Payer: Self-pay

## 2022-08-16 ENCOUNTER — Other Ambulatory Visit: Payer: Self-pay

## 2022-08-20 ENCOUNTER — Other Ambulatory Visit: Payer: Self-pay

## 2022-09-04 ENCOUNTER — Encounter: Payer: Self-pay | Admitting: Family Medicine

## 2022-09-04 ENCOUNTER — Ambulatory Visit (INDEPENDENT_AMBULATORY_CARE_PROVIDER_SITE_OTHER): Payer: Commercial Managed Care - PPO | Admitting: Family Medicine

## 2022-09-04 VITALS — BP 132/82 | HR 94 | Temp 97.9°F | Ht 68.0 in | Wt 203.6 lb

## 2022-09-04 DIAGNOSIS — Z125 Encounter for screening for malignant neoplasm of prostate: Secondary | ICD-10-CM | POA: Diagnosis not present

## 2022-09-04 DIAGNOSIS — Z0001 Encounter for general adult medical examination with abnormal findings: Secondary | ICD-10-CM

## 2022-09-04 DIAGNOSIS — I10 Essential (primary) hypertension: Secondary | ICD-10-CM

## 2022-09-04 DIAGNOSIS — Z1211 Encounter for screening for malignant neoplasm of colon: Secondary | ICD-10-CM

## 2022-09-04 DIAGNOSIS — Z0000 Encounter for general adult medical examination without abnormal findings: Secondary | ICD-10-CM

## 2022-09-04 NOTE — Progress Notes (Addendum)
Subjective:    Patient ID: Leonard Schmitt, male    DOB: September 10, 1975, 47 y.o.   MRN: [id number] Patient is here today for complete physical exam.  He states that his blood pressure at home has been less than 140/90 consistently.  He denies any chest pain shortness of breath dyspnea on exertion.  He is due for colonoscopy.  He is due for prostate cancer screening.  He has a history of brother with possible prostate cancer.  He denies any melena or hematochezia.  Otherwise he is doing well with no concerns.   Past Medical History:  Diagnosis Date   Hyperlipemia    Hypertension    borderline   No past surgical history on file. Current Outpatient Medications on File Prior to Visit  Medication Sig Dispense Refill   atorvastatin (LIPITOR) 40 MG tablet Take 1 tablet (40 mg total) by mouth daily. 90 tablet 3   losartan-hydrochlorothiazide (HYZAAR) 100-25 MG tablet Take 1 tablet by mouth daily. 90 tablet 3   sildenafil (VIAGRA) 100 MG tablet Take 0.5-1 tablets (50-100 mg total) by mouth daily as needed for erectile dysfunction. 10 tablet 5   No current facility-administered medications on file prior to visit.   No Known Allergies Social History   Socioeconomic History   Marital status: Married    Spouse name: Not on file   Number of children: Not on file   Years of education: Masters   Highest education level: Not on file  Occupational History   Occupation: [employer]  Tobacco Use   Smoking status: Never   Smokeless tobacco: Never  Vaping Use   Vaping status: Never Used  Substance and Sexual Activity   Alcohol use: Yes    Alcohol/week: 6.0 - 7.0 standard drinks of alcohol    Types: 4 Cans of beer, 2 - 3 Shots of liquor per week    Comment: 1-2 beer every other day   Drug use: No   Sexual activity: Yes  Other Topics Concern   Not on file  Social History Narrative   Not on file   Social Determinants of Health   Financial Resource Strain: Not on file  Food  Insecurity: Not on file  Transportation Needs: Not on file  Physical Activity: Not on file  Stress: Not on file  Social Connections: Not on file  Intimate Partner Violence: Not on file      Review of Systems  All other systems reviewed and are negative.      Objective:   Physical Exam Vitals reviewed.  Constitutional:      General: He is not in acute distress.    Appearance: Normal appearance. He is normal weight. He is not ill-appearing or toxic-appearing.  HENT:     Head: Normocephalic and atraumatic.     Right Ear: Tympanic membrane and ear canal normal.     Left Ear: Tympanic membrane and ear canal normal.     Nose: Nose normal. No congestion or rhinorrhea.     Mouth/Throat:     Mouth: Mucous membranes are moist.     Pharynx: No oropharyngeal exudate or posterior oropharyngeal erythema.  Eyes:     Extraocular Movements: Extraocular movements intact.     Conjunctiva/sclera: Conjunctivae normal.     Pupils: Pupils are equal, round, and reactive to light.  Neck:     Vascular: No carotid bruit or JVD.  Cardiovascular:     Rate and Rhythm: Normal rate and regular rhythm.  Heart sounds: Normal heart sounds. No murmur heard. Pulmonary:     Effort: Pulmonary effort is normal. No respiratory distress.     Breath sounds: Normal breath sounds. No stridor. No wheezing, rhonchi or rales.  Abdominal:     General: Bowel sounds are normal. There is no distension.     Palpations: Abdomen is soft.     Tenderness: There is no abdominal tenderness. There is no guarding or rebound.  Musculoskeletal:     Cervical back: Normal range of motion. No rigidity.     Right lower leg: No edema.     Left lower leg: No edema.  Lymphadenopathy:     Cervical: No cervical adenopathy.  Skin:    Findings: No rash.  Neurological:     General: No focal deficit present.     Mental Status: He is alert and oriented to person, place, and time. Mental status is at baseline.     Cranial Nerves: No  cranial nerve deficit.     Sensory: No sensory deficit.     Motor: No weakness.     Coordination: Coordination normal.     Gait: Gait normal.     Deep Tendon Reflexes: Reflexes normal.  Psychiatric:        Mood and Affect: Mood normal.        Behavior: Behavior normal.        Thought Content: Thought content normal.         Assessment & Plan:  Benign essential HTN - Plan: CBC with Differential/Platelet, COMPLETE METABOLIC PANEL WITH GFR, Lipid panel  Prostate cancer screening - Plan: PSA  Colon cancer screening - Plan: Cologuard  General medical exam  I am very happy with his blood pressure today.  I will check a CBC and CMP and lipid panel.  I would like his LDL cholesterol to be less than 100.  Screen for prostate cancer with PSA.  Patient declines colonoscopy but consents to Cologuard screening.

## 2022-09-05 LAB — CBC WITH DIFFERENTIAL/PLATELET
Absolute Monocytes: 628 cells/uL (ref 200–950)
Basophils Absolute: 28 {cells}/uL (ref 0–200)
Basophils Relative: 0.4 %
Eosinophils Absolute: 152 {cells}/uL (ref 15–500)
Eosinophils Relative: 2.2 %
HCT: 48.2 % (ref 38.5–50.0)
Hemoglobin: 15.9 g/dL (ref 13.2–17.1)
Lymphs Abs: 1732 {cells}/uL (ref 850–3900)
MCH: 27.1 pg (ref 27.0–33.0)
MCHC: 33 g/dL (ref 32.0–36.0)
MCV: 82.3 fL (ref 80.0–100.0)
MPV: 10.6 fL (ref 7.5–12.5)
Monocytes Relative: 9.1 %
Neutro Abs: 4361 {cells}/uL (ref 1500–7800)
Neutrophils Relative %: 63.2 %
Platelets: 230 10*3/uL (ref 140–400)
RBC: 5.86 10*6/uL — ABNORMAL HIGH (ref 4.20–5.80)
RDW: 13.7 % (ref 11.0–15.0)
Total Lymphocyte: 25.1 %
WBC: 6.9 10*3/uL (ref 3.8–10.8)

## 2022-09-05 LAB — PSA: PSA: 0.43 ng/mL (ref <=4.00)

## 2022-09-05 LAB — LIPID PANEL
Cholesterol: 177 mg/dL (ref <200)
HDL: 55 mg/dL (ref >=40)
LDL Cholesterol (Calc): 99 mg/dL
Non-HDL Cholesterol (Calc): 122 mg/dL (ref <130)
Total CHOL/HDL Ratio: 3.2 (calc) (ref <5.0)
Triglycerides: 126 mg/dL (ref <150)

## 2022-09-05 LAB — COMPLETE METABOLIC PANEL WITH GFR
AG Ratio: 1.7 (calc) (ref 1.0–2.5)
ALT: 114 U/L — ABNORMAL HIGH (ref 9–46)
AST: 66 U/L — ABNORMAL HIGH (ref 10–40)
Albumin: 4.7 g/dL (ref 3.6–5.1)
Alkaline phosphatase (APISO): 70 U/L (ref 36–130)
BUN: 17 mg/dL (ref 7–25)
CO2: 29 mmol/L (ref 20–32)
Calcium: 9.7 mg/dL (ref 8.6–10.3)
Chloride: 96 mmol/L — ABNORMAL LOW (ref 98–110)
Creat: 1.24 mg/dL (ref 0.60–1.29)
Globulin: 2.7 g/dL (calc) (ref 1.9–3.7)
Glucose, Bld: 100 mg/dL — ABNORMAL HIGH (ref 65–99)
Potassium: 3.9 mmol/L (ref 3.5–5.3)
Sodium: 138 mmol/L (ref 135–146)
Total Bilirubin: 0.7 mg/dL (ref 0.2–1.2)
Total Protein: 7.4 g/dL (ref 6.1–8.1)
eGFR: 71 mL/min/{1.73_m2} (ref >=60)

## 2022-09-21 ENCOUNTER — Other Ambulatory Visit: Payer: Self-pay

## 2022-11-30 ENCOUNTER — Other Ambulatory Visit: Payer: Self-pay

## 2023-02-21 ENCOUNTER — Other Ambulatory Visit: Payer: Self-pay

## 2023-02-21 ENCOUNTER — Other Ambulatory Visit: Payer: Self-pay | Admitting: Family Medicine

## 2023-02-21 MED FILL — Atorvastatin Calcium Tab 40 MG (Base Equivalent): ORAL | 90 days supply | Qty: 90 | Fill #0 | Status: AC

## 2023-02-25 ENCOUNTER — Other Ambulatory Visit: Payer: Self-pay

## 2023-04-11 ENCOUNTER — Other Ambulatory Visit: Payer: Self-pay

## 2023-04-11 ENCOUNTER — Other Ambulatory Visit: Payer: Self-pay | Admitting: Family Medicine

## 2023-04-12 ENCOUNTER — Other Ambulatory Visit: Payer: Self-pay | Admitting: Family Medicine

## 2023-04-12 ENCOUNTER — Other Ambulatory Visit: Payer: Self-pay

## 2023-04-15 ENCOUNTER — Other Ambulatory Visit: Payer: Self-pay | Admitting: Family Medicine

## 2023-04-15 ENCOUNTER — Other Ambulatory Visit: Payer: Self-pay

## 2023-04-15 MED FILL — Sildenafil Citrate Tab 100 MG: ORAL | 30 days supply | Qty: 6 | Fill #0 | Status: AC

## 2023-04-15 NOTE — Telephone Encounter (Signed)
 Requested medication (s) are due for refill today: yes  Requested medication (s) are on the active medication list: yes  Last refill:  06/20/22 #10 5 RF  Future visit scheduled: no  Notes to clinic:  abnormal labs   Requested Prescriptions  Pending Prescriptions Disp Refills   sildenafil (VIAGRA) 100 MG tablet 10 tablet 5    Sig: Take 0.5-1 tablets (50-100 mg total) by mouth daily as needed for erectile dysfunction.     Urology: Erectile Dysfunction Agents Failed - 04/15/2023  2:55 PM      Failed - AST in normal range and within 360 days    AST  Date Value Ref Range Status  09/04/2022 66 (H) 10 - 40 U/L Final         Failed - ALT in normal range and within 360 days    ALT  Date Value Ref Range Status  09/04/2022 114 (H) 9 - 46 U/L Final         Passed - Last BP in normal range    BP Readings from Last 1 Encounters:  09/04/22 132/82         Passed - Valid encounter within last 12 months    Recent Outpatient Visits           7 months ago Benign essential HTN   [facility] Pickard, Priscille Heidelberg, MD

## 2023-04-15 NOTE — Telephone Encounter (Signed)
 Copied from CRM [id number]. Topic: Clinical - Medication Refill >> Apr 15, 2023 11:25 AM Elle L wrote: Most Recent Primary Care Visit:  Provider: Lynnea Ferrier T  Department: [facility]  Visit Type: PHYSICAL  Date: 09/04/2022  Medication: sildenafil (VIAGRA) 100 MG tablet   Unable to pend medication due to this error message: The following orders require a refusal reason to be discarded   Has the patient contacted their pharmacy? Yes  Is this the correct pharmacy for this prescription? Yes  This is the patient's preferred pharmacy:   [facility] [address] [place] [address] Phone: [phone] Fax: [phone]  Has the prescription been filled recently? No  Is the patient out of the medication? Yes  Has the patient been seen for an appointment in the last year OR does the patient have an upcoming appointment? Yes  Can we respond through MyChart? Yes  Agent: Please be advised that Rx refills may take up to 3 business days. We ask that you follow-up with your pharmacy.

## 2023-04-17 NOTE — Telephone Encounter (Signed)
 Duplicate request.  Requested Prescriptions  Pending Prescriptions Disp Refills   sildenafil (VIAGRA) 100 MG tablet 5 tablet 11    Sig: Take 0.5-1 tablets (50-100 mg total) by mouth daily as needed for erectile dysfunction.     Urology: Erectile Dysfunction Agents Failed - 04/17/2023 11:28 AM      Failed - AST in normal range and within 360 days    AST  Date Value Ref Range Status  09/04/2022 66 (H) 10 - 40 U/L Final         Failed - ALT in normal range and within 360 days    ALT  Date Value Ref Range Status  09/04/2022 114 (H) 9 - 46 U/L Final         Passed - Last BP in normal range    BP Readings from Last 1 Encounters:  09/04/22 132/82         Passed - Valid encounter within last 12 months    Recent Outpatient Visits           7 months ago Benign essential HTN   [facility] Pickard, Priscille Heidelberg, MD

## 2023-06-03 ENCOUNTER — Other Ambulatory Visit: Payer: Self-pay | Admitting: Family Medicine

## 2023-06-03 ENCOUNTER — Other Ambulatory Visit: Payer: Self-pay

## 2023-06-03 MED FILL — Atorvastatin Calcium Tab 40 MG (Base Equivalent): ORAL | 90 days supply | Qty: 90 | Fill #1 | Status: AC

## 2023-06-03 MED FILL — Sildenafil Citrate Tab 100 MG: ORAL | 30 days supply | Qty: 6 | Fill #1 | Status: AC

## 2023-06-04 ENCOUNTER — Other Ambulatory Visit: Payer: Self-pay

## 2023-07-15 ENCOUNTER — Other Ambulatory Visit: Payer: Self-pay

## 2023-07-15 MED FILL — Sildenafil Citrate Tab 100 MG: ORAL | 30 days supply | Qty: 6 | Fill #2 | Status: AC

## 2023-08-07 ENCOUNTER — Other Ambulatory Visit: Payer: Self-pay | Admitting: Family Medicine

## 2023-08-07 MED FILL — Sildenafil Citrate Tab 100 MG: ORAL | 30 days supply | Qty: 6 | Fill #3 | Status: AC

## 2023-08-08 ENCOUNTER — Other Ambulatory Visit: Payer: Self-pay

## 2023-08-08 ENCOUNTER — Other Ambulatory Visit: Payer: Self-pay | Admitting: Family Medicine

## 2023-08-09 ENCOUNTER — Other Ambulatory Visit: Payer: Self-pay

## 2023-08-12 ENCOUNTER — Other Ambulatory Visit: Payer: Self-pay

## 2023-08-12 ENCOUNTER — Other Ambulatory Visit: Payer: Self-pay | Admitting: Family Medicine

## 2023-08-12 DIAGNOSIS — I10 Essential (primary) hypertension: Secondary | ICD-10-CM

## 2023-08-13 ENCOUNTER — Other Ambulatory Visit: Payer: Self-pay

## 2023-08-13 MED FILL — Losartan Potassium & Hydrochlorothiazide Tab 100-25 MG: ORAL | 90 days supply | Qty: 90 | Fill #0 | Status: CN

## 2023-08-23 ENCOUNTER — Other Ambulatory Visit: Payer: Self-pay

## 2023-08-27 ENCOUNTER — Other Ambulatory Visit: Payer: Self-pay

## 2023-08-27 MED FILL — Losartan Potassium & Hydrochlorothiazide Tab 100-25 MG: ORAL | 90 days supply | Qty: 90 | Fill #0 | Status: AC

## 2023-09-27 ENCOUNTER — Other Ambulatory Visit: Payer: Self-pay

## 2023-09-27 MED FILL — Sildenafil Citrate Tab 100 MG: ORAL | 30 days supply | Qty: 6 | Fill #4 | Status: AC

## 2023-09-30 ENCOUNTER — Other Ambulatory Visit: Payer: Self-pay

## 2023-11-12 ENCOUNTER — Other Ambulatory Visit: Payer: Self-pay

## 2023-11-12 MED FILL — Sildenafil Citrate Tab 100 MG: ORAL | 30 days supply | Qty: 6 | Fill #5 | Status: AC

## 2023-12-17 ENCOUNTER — Other Ambulatory Visit: Payer: Self-pay

## 2023-12-17 MED FILL — Sildenafil Citrate Tab 100 MG: ORAL | 30 days supply | Qty: 6 | Fill #6 | Status: CN

## 2023-12-17 MED FILL — Sildenafil Citrate Tab 100 MG: ORAL | 30 days supply | Qty: 6 | Fill #6 | Status: AC

## 2023-12-17 MED FILL — Losartan Potassium & Hydrochlorothiazide Tab 100-25 MG: ORAL | 90 days supply | Qty: 90 | Fill #1 | Status: AC

## 2024-01-17 ENCOUNTER — Other Ambulatory Visit: Payer: Self-pay

## 2024-01-17 ENCOUNTER — Other Ambulatory Visit: Payer: Self-pay | Admitting: Family Medicine

## 2024-01-17 ENCOUNTER — Other Ambulatory Visit (HOSPITAL_COMMUNITY): Payer: Self-pay

## 2024-01-17 MED FILL — Sildenafil Citrate Tab 100 MG: ORAL | 30 days supply | Qty: 6 | Fill #7 | Status: AC

## 2024-01-20 ENCOUNTER — Other Ambulatory Visit (HOSPITAL_COMMUNITY): Payer: Self-pay
